# Patient Record
Sex: Female | Born: 1955 | Race: Black or African American | Hispanic: No | Marital: Single | Smoking: Current every day smoker
Health system: Southern US, Community
[De-identification: ages and names within clinical notes are randomized; demographics above are authoritative.]

## PROBLEM LIST (undated history)

## (undated) DIAGNOSIS — K08109 Complete loss of teeth, unspecified cause, unspecified class: Secondary | ICD-10-CM

## (undated) DIAGNOSIS — Z86018 Personal history of other benign neoplasm: Secondary | ICD-10-CM

## (undated) DIAGNOSIS — R0600 Dyspnea, unspecified: Secondary | ICD-10-CM

## (undated) DIAGNOSIS — E210 Primary hyperparathyroidism: Secondary | ICD-10-CM

## (undated) DIAGNOSIS — R350 Frequency of micturition: Secondary | ICD-10-CM

## (undated) DIAGNOSIS — N201 Calculus of ureter: Secondary | ICD-10-CM

## (undated) DIAGNOSIS — E559 Vitamin D deficiency, unspecified: Secondary | ICD-10-CM

## (undated) DIAGNOSIS — N182 Chronic kidney disease, stage 2 (mild): Secondary | ICD-10-CM

## (undated) DIAGNOSIS — I10 Essential (primary) hypertension: Secondary | ICD-10-CM

## (undated) DIAGNOSIS — K219 Gastro-esophageal reflux disease without esophagitis: Secondary | ICD-10-CM

## (undated) DIAGNOSIS — E041 Nontoxic single thyroid nodule: Secondary | ICD-10-CM

## (undated) DIAGNOSIS — Z973 Presence of spectacles and contact lenses: Secondary | ICD-10-CM

## (undated) DIAGNOSIS — N200 Calculus of kidney: Secondary | ICD-10-CM

## (undated) HISTORY — PX: LAPAROSCOPIC ASSISTED VAGINAL HYSTERECTOMY: SHX5398

## (undated) HISTORY — PX: MINIMALLY INVASIVE RADIOACTIVE PARATHYROIDECTOMY: SHX5272

## (undated) HISTORY — PX: OTHER SURGICAL HISTORY: SHX169

---

## 1999-09-14 ENCOUNTER — Encounter: Payer: Self-pay | Admitting: Emergency Medicine

## 1999-09-14 ENCOUNTER — Emergency Department (HOSPITAL_COMMUNITY): Admission: EM | Admit: 1999-09-14 | Discharge: 1999-09-14 | Payer: Self-pay | Admitting: Emergency Medicine

## 2000-03-16 ENCOUNTER — Inpatient Hospital Stay (HOSPITAL_COMMUNITY): Admission: EM | Admit: 2000-03-16 | Discharge: 2000-03-19 | Payer: Self-pay | Admitting: Emergency Medicine

## 2000-03-16 ENCOUNTER — Encounter: Payer: Self-pay | Admitting: Emergency Medicine

## 2000-03-17 ENCOUNTER — Encounter: Payer: Self-pay | Admitting: Family Medicine

## 2002-04-01 ENCOUNTER — Emergency Department (HOSPITAL_COMMUNITY): Admission: EM | Admit: 2002-04-01 | Discharge: 2002-04-01 | Payer: Self-pay | Admitting: Emergency Medicine

## 2002-05-01 ENCOUNTER — Encounter: Payer: Self-pay | Admitting: *Deleted

## 2002-05-01 ENCOUNTER — Encounter: Admission: RE | Admit: 2002-05-01 | Discharge: 2002-05-01 | Payer: Self-pay | Admitting: *Deleted

## 2002-05-30 ENCOUNTER — Other Ambulatory Visit: Admission: RE | Admit: 2002-05-30 | Discharge: 2002-05-30 | Payer: Self-pay | Admitting: Obstetrics and Gynecology

## 2002-06-12 ENCOUNTER — Ambulatory Visit (HOSPITAL_COMMUNITY): Admission: RE | Admit: 2002-06-12 | Discharge: 2002-06-12 | Payer: Self-pay | Admitting: Obstetrics and Gynecology

## 2002-06-12 ENCOUNTER — Encounter: Payer: Self-pay | Admitting: Obstetrics and Gynecology

## 2002-07-03 ENCOUNTER — Encounter: Payer: Self-pay | Admitting: Gastroenterology

## 2002-07-03 ENCOUNTER — Encounter: Admission: RE | Admit: 2002-07-03 | Discharge: 2002-07-03 | Payer: Self-pay | Admitting: Gastroenterology

## 2002-09-25 ENCOUNTER — Encounter (INDEPENDENT_AMBULATORY_CARE_PROVIDER_SITE_OTHER): Payer: Self-pay

## 2002-09-25 ENCOUNTER — Inpatient Hospital Stay (HOSPITAL_COMMUNITY): Admission: RE | Admit: 2002-09-25 | Discharge: 2002-09-26 | Payer: Self-pay | Admitting: Obstetrics and Gynecology

## 2005-07-17 ENCOUNTER — Encounter: Admission: RE | Admit: 2005-07-17 | Discharge: 2005-07-17 | Payer: Self-pay | Admitting: Cardiology

## 2005-09-28 ENCOUNTER — Ambulatory Visit (HOSPITAL_COMMUNITY): Admission: RE | Admit: 2005-09-28 | Discharge: 2005-09-28 | Payer: Self-pay | Admitting: General Surgery

## 2005-11-03 ENCOUNTER — Encounter (INDEPENDENT_AMBULATORY_CARE_PROVIDER_SITE_OTHER): Payer: Self-pay | Admitting: Specialist

## 2005-11-03 ENCOUNTER — Ambulatory Visit (HOSPITAL_COMMUNITY): Admission: RE | Admit: 2005-11-03 | Discharge: 2005-11-04 | Payer: Self-pay | Admitting: General Surgery

## 2005-11-03 HISTORY — PX: PARATHYROIDECTOMY: SHX19

## 2006-08-29 ENCOUNTER — Emergency Department (HOSPITAL_COMMUNITY): Admission: EM | Admit: 2006-08-29 | Discharge: 2006-08-29 | Payer: Self-pay | Admitting: Emergency Medicine

## 2006-11-14 ENCOUNTER — Encounter: Admission: RE | Admit: 2006-11-14 | Discharge: 2006-11-14 | Payer: Self-pay | Admitting: Cardiology

## 2010-11-19 ENCOUNTER — Observation Stay (HOSPITAL_COMMUNITY)
Admission: EM | Admit: 2010-11-19 | Discharge: 2010-11-21 | Payer: Self-pay | Source: Home / Self Care | Attending: Internal Medicine | Admitting: Internal Medicine

## 2010-11-20 ENCOUNTER — Encounter (INDEPENDENT_AMBULATORY_CARE_PROVIDER_SITE_OTHER): Payer: Self-pay | Admitting: Family Medicine

## 2010-12-29 ENCOUNTER — Other Ambulatory Visit (HOSPITAL_COMMUNITY): Payer: Self-pay | Admitting: Cardiology

## 2011-01-16 ENCOUNTER — Ambulatory Visit (HOSPITAL_COMMUNITY)
Admission: RE | Admit: 2011-01-16 | Discharge: 2011-01-16 | Disposition: A | Discharge status: Home / Self Care | Payer: Medicaid Other | Source: Ambulatory Visit | Attending: Cardiology | Admitting: Cardiology

## 2011-01-16 DIAGNOSIS — R079 Chest pain, unspecified: Secondary | ICD-10-CM | POA: Insufficient documentation

## 2011-01-16 MED ORDER — TECHNETIUM TC 99M TETROFOSMIN IV KIT
30.0000 | PACK | Freq: Once | INTRAVENOUS | Status: AC | PRN
  Administered 2011-01-16: 30 via INTRAVENOUS

## 2011-01-16 MED ORDER — TECHNETIUM TC 99M TETROFOSMIN IV KIT
10.0000 | PACK | Freq: Once | INTRAVENOUS | Status: AC | PRN
  Administered 2011-01-16: 10 via INTRAVENOUS

## 2011-01-17 LAB — LIPID PANEL
Cholesterol: 172 mg/dL (ref 0–200)
LDL Cholesterol: 79 mg/dL (ref 0–99)
Total CHOL/HDL Ratio: 2.5 RATIO
Triglycerides: 114 mg/dL (ref <150)
VLDL: 23 mg/dL (ref 0–40)

## 2011-01-17 LAB — COMPREHENSIVE METABOLIC PANEL
ALT: 16 U/L (ref 0–35)
AST: 18 U/L (ref 0–37)
Albumin: 4.1 g/dL (ref 3.5–5.2)
Alkaline Phosphatase: 56 U/L (ref 39–117)
CO2: 27 mEq/L (ref 19–32)
Creatinine, Ser: 1.34 mg/dL — ABNORMAL HIGH (ref 0.4–1.2)
Potassium: 4.7 mEq/L (ref 3.5–5.1)
Total Protein: 7.7 g/dL (ref 6.0–8.3)

## 2011-01-17 LAB — CBC
HCT: 34.2 % — ABNORMAL LOW (ref 36.0–46.0)
MCV: 85.3 fL (ref 78.0–100.0)
RDW: 14.9 % (ref 11.5–15.5)

## 2011-01-25 ENCOUNTER — Other Ambulatory Visit: Payer: Self-pay | Admitting: Gastroenterology

## 2011-02-13 LAB — DIFFERENTIAL
Basophils Absolute: 0 10*3/uL (ref 0.0–0.1)
Basophils Relative: 1 % (ref 0–1)
Eosinophils Absolute: 0.2 10*3/uL (ref 0.0–0.7)
Eosinophils Relative: 3 % (ref 0–5)
Lymphocytes Relative: 42 % (ref 12–46)
Monocytes Relative: 7 % (ref 3–12)
Monocytes Relative: 7 % (ref 3–12)
Neutro Abs: 3.5 10*3/uL (ref 1.7–7.7)
Neutro Abs: 4.7 10*3/uL (ref 1.7–7.7)
Neutrophils Relative %: 58 % (ref 43–77)

## 2011-02-13 LAB — LIPID PANEL
HDL: 66 mg/dL (ref >39)
Total CHOL/HDL Ratio: 3.5 RATIO
Triglycerides: 323 mg/dL — ABNORMAL HIGH (ref <150)

## 2011-02-13 LAB — GLUCOSE, CAPILLARY
Glucose-Capillary: 100 mg/dL — ABNORMAL HIGH (ref 70–99)
Glucose-Capillary: 106 mg/dL — ABNORMAL HIGH (ref 70–99)
Glucose-Capillary: 109 mg/dL — ABNORMAL HIGH (ref 70–99)
Glucose-Capillary: 121 mg/dL — ABNORMAL HIGH (ref 70–99)

## 2011-02-13 LAB — TSH: TSH: 2.094 u[IU]/mL (ref 0.350–4.500)

## 2011-02-13 LAB — BASIC METABOLIC PANEL
BUN: 18 mg/dL (ref 6–23)
CO2: 25 mEq/L (ref 19–32)
Calcium: 10 mg/dL (ref 8.4–10.5)
GFR calc non Af Amer: 55 mL/min — ABNORMAL LOW (ref >60)
Sodium: 138 mEq/L (ref 135–145)

## 2011-02-13 LAB — POCT CARDIAC MARKERS: Myoglobin, poc: 78.6 ng/mL (ref 12–200)

## 2011-02-13 LAB — PROTIME-INR
INR: 0.97 (ref 0.00–1.49)
Prothrombin Time: 13.1 seconds (ref 11.6–15.2)

## 2011-02-13 LAB — CBC
HCT: 39.6 % (ref 36.0–46.0)
Hemoglobin: 11.8 g/dL — ABNORMAL LOW (ref 12.0–15.0)
MCH: 28.6 pg (ref 26.0–34.0)
MCH: 28.8 pg (ref 26.0–34.0)
MCHC: 32.8 g/dL (ref 30.0–36.0)
MCV: 89.3 fL (ref 78.0–100.0)
Platelets: 241 10*3/uL (ref 150–400)
Platelets: 274 10*3/uL (ref 150–400)
RBC: 4.51 MIL/uL (ref 3.87–5.11)

## 2011-02-13 LAB — COMPREHENSIVE METABOLIC PANEL
Albumin: 4 g/dL (ref 3.5–5.2)
BUN: 14 mg/dL (ref 6–23)
CO2: 24 mEq/L (ref 19–32)
Chloride: 106 mEq/L (ref 96–112)
GFR calc Af Amer: >60 mL/min (ref >60)
Potassium: 4.3 mEq/L (ref 3.5–5.1)

## 2011-02-13 LAB — CARDIAC PANEL(CRET KIN+CKTOT+MB+TROPI)
CK, MB: 2.7 ng/mL (ref 0.3–4.0)
Relative Index: 1.7 (ref 0.0–2.5)
Relative Index: 1.8 (ref 0.0–2.5)
Relative Index: 2 (ref 0.0–2.5)
Total CK: 134 U/L (ref 7–177)

## 2011-04-21 NOTE — Op Note (Signed)
NAME:  Sherry Arroyo, Sherry Arroyo                       ACCOUNT NO.:  [id number]   MEDICAL RECORD NO.:  [id number]                   PATIENT TYPE:  INP   LOCATION:  9399                                 FACILITY:  [facility]   PHYSICIAN:  Naima A. Dillard, M.D.              DATE OF BIRTH:  09/04/1956   DATE OF PROCEDURE:  DATE OF DISCHARGE:                                 OPERATIVE REPORT   PREOPERATIVE DIAGNOSES:  Symptomatic fibroids, patient desired definitive  treatment.   POSTOPERATIVE DIAGNOSES:  Symptomatic fibroids, patient desired definitive  treatment.   PROCEDURE:  1. Laparoscopic assisted vaginal hysterectomy.  2. Bilateral salpingo-oophorectomy.   SURGEON:  Naima A. Normand Sloop, M.D.   ASSISTANTMarquis Lunch. Adline Peals.   ANESTHESIA:  General endotracheal tube.   ESTIMATED BLOOD LOSS:  350 cc.   IV FLUIDS:  3000 cc crystalloid.   URINE OUTPUT:  1400 cc clear urine.   COMPLICATIONS:  None.   FINDINGS:  A 12-week-sized myomatous uterus with normal adnexa bilaterally,  normal appendix, and normal abdominal anatomy.  She did have a small omental  adhesion to the anterior abdominal wall.   DISPOSITION:  The patient went to recovery room in stable condition.   PROCEDURE:  Before the procedure took place, I did a wet mount to make sure  she had a trichomonas __________ cure and there were no trichomonas seen on  the wet mount.  We also discussed more self-initiative and whether she  wanted her ovaries removed and patient stated she wanted her ovaries  removed.  The patient also understood the risks of the surgery are, but not  limited to, bleeding, infection, damage to internal organs like bowel,  bladder, ureters, and internal blood vessels.   The patient was taken to the operating room, given general anesthesia, and  placed in the dorsal lithotomy position.  She was prepped and draped in a  normal sterile fashion and a bivalve speculum was placed into the vagina.  A  Hulka  tenaculum was placed into the uterine cavity as a means to manipulate  the uterus.  A Foley catheter was placed into the bladder without  difficulty.  Attention was then turned to the patient's abdomen.  She had a  previous tubal which obliterated her umbilicus.  Because of this we did an  open laparoscopy.  Marcaine 5 cc was placed into the incision site and  opened to the fascia.  The fascia was then incised in the midline and  opened.  Peritoneum was identified, tented up and opened without difficulty.  The Hasson was then placed and the fascia was tagged with 0 Vicryl and  attached to the Hasson.  The abdomen was then insufflated with CO2 gas.  Two  lower quadrant 5 mm incisions were made, one on the right and one on the  left.  5 cc Marcaine was placed first and two 5 mm umbilical ports were  placed under direct visualization of the laparoscope.  Hemostasis was  assured.  Attention was then turned to the patient's right round ligament  which was tented up, cauterized, and cut without difficulty.  The patient's  left round ligament was identified, cauterized, and cut without difficulty.  Hydrodissection was placed into the vesicouterine peritoneum and bladder  flap plane was created with Metzenbaum scissors, placed through the port and  bladder flap was created using Metzenbaum scissors and pushed away bluntly.  The right ovarian uterine ligament was cauterized and cut.  Hemostasis was  assured.  The right ureter was then located, noted to peristalse, and be way  below the infundibulopelvic ligament.  The infundibulopelvic ligament was  then ligated with two Endo loops.  The ovary was cut away.  Hemostasis was  noted and assured.  The ovary and tube were placed in the cul-de-sac.  The  patient's left uterine ovarian ligaments were cauterized and cut and  hemostasis noted.  The ureter was located on the left and noted to be far  away from the infundibulopelvic ligament.  The two Endo  loops were placed  and the ovary and tube were cut.  Hemostasis was assured and the ovary and  tube were placed in the cul-de-sac.  The air was allowed to leave the  abdomen.  Attention was then turned to the vagina.  The patient's legs were  elevated.  Two tenaculums were placed on the lower cervix.  20 units of  vasopressin and 100 cc of normal saline was mixed and 20 cc was used to  infiltrate the entire cervix.  A circumferential incision was then made  around the cervix and the anterior peritoneum was identified, tented up, and  entered sharply with Metzenbaum scissors.  The cavity was opened and the  Foley catheter was noted to be in place.  I could feel the fibroids along  the uterus __________ and anteriorly.  I then went to the posterior aspect  peritoneum.  It was identified, tented up, and entered sharply.  Gas was let  out of this area as we knew we were into the peritoneum posteriorly.  Both  uterosacral ligaments were Heaney clamped, cut, and suture ligated with  Vicryl.  Hemostasis was assured.  Both were held with hemostats.  The  uterine arteries were doubly clamped, cut, and suture ligated.  Hemostasis  was assured.  Upon opening the posterior peritoneal space, the ovaries and  tubes were removed and were laying in the cul-de-sac without difficulty.  The uterus was then delivered with the help of towel clamps and there was a  small attachment on both sides to the broad ligament which was clamped with  Tresa Endo, cut, and suture ligated with a free tie first.  Hemostasis was  assured.  There was a small amount of bleeding near the uterosacral ligament  which was made hemostatic with a figure-of-eight stitch.  Hemostasis was  then noted except for around the cuff.  The cuff was closed with 0 Vicryl in  figure-of-eight stitches in the vertical fashion.  Before the cuff was  closed a McCall suture was placed into the posterior aspect of the vagina to the right uterosacral ligament  to the peritoneum to the left uterosacral  ligament and back out of the vagina.  The cuff was closed.  The three  uterosacral ligaments were tied together.  The cuff stitch was also tied  together and hemostasis noted.  A McCall suture was then tied and noted  to  elevate the vagina.  Hemostasis was noted.  The vagina was packed with  vaginal packing laced with triple sulfa cream.  Attention was then turned  back into the abdomen.  Both infundibulopelvic pedicles were noted to be  hemostatic.  Right along the vaginal cuff there was minimal oozing which was  made hemostatic with slight touch of cautery.  Again, irrigation was done  and hemostasis was noted.  All instruments were removed from the abdomen  under direct visualization of the laparoscope.  The 10 mm Hasson port was  closed with 0 Vicryl.  The skin was closed with 3-0 Vicryl in a subcuticular  fashion.  The 10 mm port was closed with 0 Vicryl.  The two 5 mm skin  incisions were closed in a subcuticular fashion using 3-0 Vicryl.  Hemostasis was assured.  Sponge, lap, and needle counts were correct x2.  The patient went to the recovery room in stable condition.                                               Naima A. Normand Sloop, M.D.    NAD/MEDQ  D:  09/25/2002  T:  09/25/2002  Job:  [id number]

## 2011-04-21 NOTE — Discharge Summary (Signed)
[facility]  Patient:    Sherry Arroyo, Sherry Arroyo                    MRN: [id number] Adm. Date:  [id number] Disc. Date: 03/19/00 Attending:  McDiarmid, Leighton Roach. Dictator:   Guadalupe Dawn, M.D.                           Discharge Summary  DISCHARGE DIAGNOSES: 1. Multilobar community-acquired pneumonia. 2. Polysubstance abuse.  DISCHARGE MEDICATIONS:  Azithromycin 250 mg 1 p.o. q.d. x 7 days.  DIAGNOSTIC STUDIES:  CT of the chest with and without contrast on March 18, 2000 showed no abnormality except patchy infiltrates in the right middle and lower lobes.  FOLLOW-UP:  The patient will contact [facility] for follow-up.  BRIEF HISTORY AND PHYSICAL:  This 55 year old African-American female who has a history of recurrent right middle lobe pneumonia presented with a two-day history of increased work of breathing, cough productive of yellow sputum, chills, and loss of appetite.  This illness had an acute onset.  She denied hemoptysis, nausea, or vomiting.  She admitted to smoking crack three days prior to admission and admitted to chronic alcohol abuse.  She also admitted to smoking 1-1/2 packs per day x 26 years.  PHYSICAL EXAMINATION:  VITAL SIGNS:  Temperature 101.7, oxygen saturation 85% on room air.  LUNGS:  Dense rhonchi in the right lower lobe and rales to the mid-lung fields on the right.  Also, a 2/6 systolic ejection murmur.  Chest x-ray showed right middle lobe and right lower lobe infiltrates.  HOSPITAL COURSE: #1 - PNEUMONIA:  The patient was started on intravenous Rocephin and azithromycin and had good response, becoming afebrile within 24 hours.  She was switched to oral antibiotics on hospital day #2 and remained afebrile until discharge.  Because of her history of recurrent pneumonia, CT scan of the chest with and without contrast was performed with the results noted above.  At the time of discharge, she was tolerating a regular  diet, ambulating without difficulty, and had normal oxygen saturations on room air and was afebrile.  #2 - POLYSUBSTANCE ABUSE:  Rehabilitation was offered by medical doctor and was refused.  She was advised of the health risks and stated that she understood them. DD:  03/19/00 TD:  03/19/00 Job: [id number]

## 2011-04-21 NOTE — Discharge Summary (Signed)
   NAME:  Sherry Arroyo, Sherry Arroyo                       ACCOUNT NO.:  [id number]   MEDICAL RECORD NO.:  [id number]                   PATIENT TYPE:  AMB   LOCATION:  ENDO                                 FACILITY:  [facility]   PHYSICIAN:  Naima A. Dillard, M.D.              DATE OF BIRTH:  Dec 25, 1955   DATE OF ADMISSION:  DATE OF DISCHARGE:                                 DISCHARGE SUMMARY   PER DISCHARGE DIAGNOSIS:  Symptomatic fibroids.   POSTOP DISCHARGE DIAGNOSIS:  Status post lower anterior vaginal  hysterectomy, bilateral salpingo-oophorectomy for symptomatic fibroids.   DISCHARGE MEDICATIONS:  Includes Tylox, Motrin, Phenergan, iron, and Colace.   ACTIVITY:  To remain on pelvic rest for six weeks. No heavy lifting for four  weeks and no driving for two weeks.   FOLLOW UP:  The patient will follow-up with [facility] on November 07, 2002 for postoperative visit.   HOSPITAL COURSE:  The patient underwent LAVH and bilateral salpingo-  oophorectomy for symptomatic fibroids. On postoperative day zero, she was  doing well. Stated that she was hungry and tolerated a full liquid diet. She  had flatus and still tolerated full died. Her abdomen was soft and the  incision was clean, dry, and intact. The patient was discharged to home with  instructions as above.   LABORATORY DATA:  Preoperative hemoglobin 10.3 and postoperative was 8.7.  Preoperative  BUN and creatinine was 11 and 1. Postoperative BUN and  creatinine was 3 and 0.8.   DISPOSITION:  The patient was discharged to home in stable condition.                                               Naima A. Normand Sloop, M.D.    NAD/MEDQ  D:  10/24/2002  T:  10/24/2002  Job:  [id number]

## 2011-04-21 NOTE — H&P (Signed)
NAME:  Sherry Arroyo, Sherry Arroyo                       ACCOUNT NO.:  [id number]   MEDICAL RECORD NO.:  [id number]                   PATIENT TYPE:  INP   LOCATION:  NA                                   FACILITY:  [facility]   PHYSICIAN:  Naima A. Dillard, M.D.              DATE OF BIRTH:  [birth year]   DATE OF ADMISSION:  DATE OF DISCHARGE:                                HISTORY & PHYSICAL   CHIEF COMPLAINT:  Symptomatic fibroids.   HISTORY OF PRESENT ILLNESS:  The patient is a 55 year old African American  female, G2, P2, who presented to me in June of 2003 complaining of lower  abdominal pain and dysmenorrhea for two months.  She had tried Tylenol No. 3  and Vioxx and got no relief.  The patient was sent to the urologist for  evaluation by her family care doctor.  He got a CT scan which was  significant for fibroids.  The patient also complained of dyspareunia.  The  patient states that her menses are not heavy, but she does have severe  cramping during menses.  She also has tried Advil and Motrin over the  counter, which gave her no relief.  The patient did not have any vaginal  discharge, odor, fever, or irregular periods, except for on one occasion.  She does have some dyspareunia.  She denied any urinary frequency, urgency,  dysuria, or hematuria.  Denied any history of kidney stones.  She does have  occasional constipation.  She denied any diarrhea, rectal bleeding, nausea,  vomiting, or history of endometriosis.  On ultrasound on June 12, 2002, the  patient had normal ovaries.  The uterus was 11.6 x 4.2 with at least about  five fibroids.  The largest was 3 x 4 cm.  The endometrial stripe measured 8  mm in thickness.  The patient was given other alternatives for the treatment  of fibroids.  However, she decided she wanted definitive treatment.   PAST MEDICAL HISTORY:  Unremarkable.   PAST SURGICAL HISTORY:  Significant for bilateral tubal ligation done  laparoscopy and left hand  surgery.   PAST OBSTETRICAL HISTORY:  Significant for full-term vaginal deliveries x 2  without any complications.   PAST GYNECOLOGICAL HISTORY:  Menarche at age 89 occurring every 28 days and  lasting for four days.  Denied having any intramenstrual bleeding, except  for on one occasion.  She does have intercourse with one partner without  using a condom.  She has no history of abnormal Pap smear.  She denied any  history of sexually transmitted diseases.  She denies having any menopausal  symptoms or irregular bleeding.   REVIEW OF SYMPTOMS:  The patient did not have any endocrine disorders.  She  did not have an cardiorespiratory disorders.  GASTROINTESTINAL:  She does  have occasional constipation.  GENITOURINARY:  As above.  She denied having  any autoimmune, neurologic, or musculoskeletal issues.  PHYSICAL EXAMINATION:  WEIGHT:  159 pounds.  VITAL SIGNS:  Blood pressure 120/80.  HEENT:  Pupils are equal.  Hearing is normal.  The throat is clear.  The  thyroid is not enlarged.  HEART:  Regular rate and rhythm.  LUNGS:  Clear to auscultation bilaterally.  BREASTS:  No masses, discharge, skin changes, or nipple retraction  bilaterally.  BACK:  No CVA tenderness.  ABDOMEN:  Nontender without any organomegaly.  EXTREMITIES:  No cyanosis, clubbing, or edema.  NEUROLOGIC:  Within normal limits.  PELVIC:  The vulva exam is within normal limits.  The vagina was normal.  The cervix was nontender without lesions.  The uterus was about 12 weeks  size, irregular, and nontender.  The adnexa had no masses and are nontender  bilaterally.   LABORATORY DATA:  Her UA was normal.  The most recent hemoglobin was 11.9.  A repeat hemoglobin was 10, so she does have some bleeding.  An endometrial  biopsy showed benign secretory endometrium and no hyperplasia or malignancy.  Ultrasound is as above.  The patient had a normal mammogram in July of 2003.  Her GC and chlamydia were both negative in  June of 2003.  Her Pap smear was  significant for Trichomonas in June of 2003.   ASSESSMENT:  1. Symptomatic fibroids.  2. Trichomonas.   The patient denies definitive therapy.  She desires a hysterectomy.  The  women's health initiative was reviewed.  The risks and benefits of removing  the ovaries were reviewed.  At this time, the patient is still desirous to  have bilateral salpingo-oophorectomy.  I will discuss this once again with  the patient right before surgery to make sure that she definitely wants her  ovaries removed.  She understands that the risks of the surgery are, but not  limited to, bleeding, infection, damage to internal organs, such as bowel,  bladder, and major blood vessels.  She was given Flagyl and stressed the  importance of using a condom with her partner, especially to avoid cuff  cellulitis after surgery.  I will check a wet mount before we proceed with  surgery for Trichomonas and if present, will give Flagyl intraoperatively.                                               Naima A. Normand Sloop, M.D.    NAD/MEDQ  D:  09/24/2002  T:  09/24/2002  Job:  [id number]

## 2011-04-21 NOTE — Op Note (Signed)
NAME:  Sherry Arroyo, Sherry Arroyo             ACCOUNT NO.:  [id number]   MEDICAL RECORD NO.:  [id number]          PATIENT TYPE:  OIB   LOCATION:  5727                         FACILITY:  [facility]   PHYSICIAN:  Leonie Man, M.D.   DATE OF BIRTH:  11/26/1956   DATE OF PROCEDURE:  11/03/2005  DATE OF DISCHARGE:  11/04/2005                                 OPERATIVE REPORT   PREOPERATIVE DIAGNOSIS:  Primary hyperparathyroidism.   POSTOPERATIVE DIAGNOSIS:  Primary hyperparathyroidism.   PROCEDURE:  MIRP (minimally invasive subparathyroidectomy).   SURGEON:  Leonie Man, M.D.   ASSISTANT:  Rose Phi. Maple Hudson, M.D.   ANESTHESIA:  General.   NOTE:  The patient is a 55 year old female presenting with hypercalcemia and  elevated parathormone level. She underwent diagnostic sestamibi scan which  indicated that presence of an increased uptake in the upper pole of the left  portion of her thyroid, consistent with a parathyroid adenoma.  The patient  comes to the operating room now for MIRP after the risks and potential  benefits of surgery have been discussed, all questions answered and consent  obtained.   DESCRIPTION OF PROCEDURE:  Following the induction of satisfactory general  anesthesia, the patient is positioned supinely; the head and neck  hyperextended. The neck was then prepped and draped to be included in a  sterile operative field. A transverse incision is carried down over the left  lateral half of the neck, deepened through skin and subcutaneous tissues,  and across the platysma muscle.  The platysma muscle is raised as a flap  superiorly and inferiorly. The midline is opened and the strap muscles  retracted laterally on the left side. Dissection is carried up toward the  upper pole of the parathyroid; and with the use of the NeoProbe, an area of  increased uptake is noted and dissection carried down to this area revealing  a very large parathyroid adenoma. This was dissected away from  the  surrounding tissues, maintaining hemostasis with stainless steel clips.   The parathyroid adenoma is removed in its entirety and forwarded for  pathologic evaluation. Frozen section diagnosis confirms the presence of  parathyroid tissue consistent with parathyroid adenoma. All areas of  dissection checked for hemostasis; noted to be dry.  I placed a small  Surgicel pad in the region of dissection for additional hemostasis. Sponge  and instrument counts were verified. The midline is closed with interrupted  3-0 Vicryl suture.  The strap muscles closed with interrupted 3-0 Vicryl suture; and the skin  closed with a running 5-0 Monocryl suture, and then reinforced with Steri-  Strips; sterile dressings applied. Anesthetic reversed; patient removed from  the operating room to the recovery room in stable condition. She tolerated  the procedure well.      Leonie Man, M.D.  Electronically Signed     PB/MEDQ  D:  11/03/2005  T:  11/05/2005  Job:  [id number]   cc:   Osvaldo Shipper. Spruill, M.D.  Fax: [phone]

## 2011-12-14 IMAGING — CR DG CHEST 1V PORT
1 series · 1 of 1 positions shown · non-contrast
Comparison: 11/14/2006

CLINICAL DATA: Chest pain, shortness of breath.

PORTABLE CHEST - 1 VIEW

[AP]
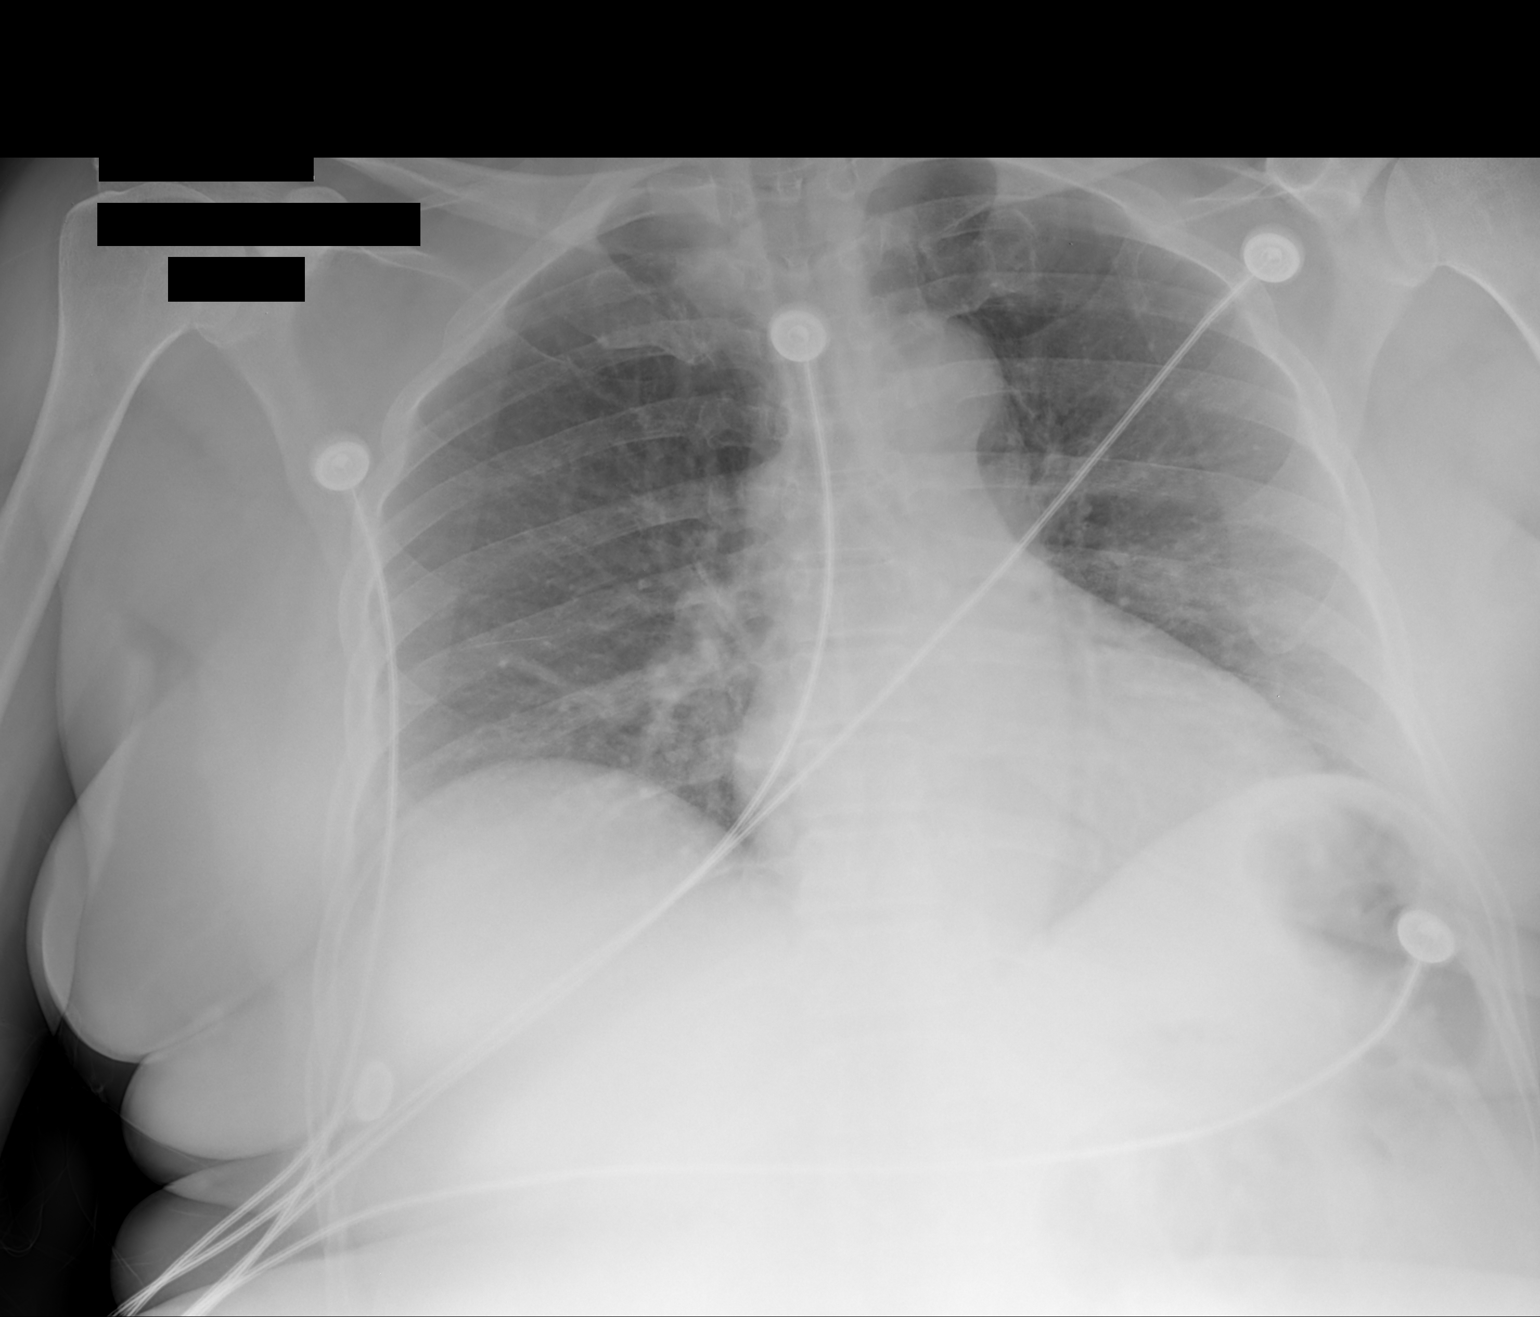

[1 of 1 positions shown; findings below may reference images not displayed]

FINDINGS: There are low lung volumes with minimal right base
atelectasis.  Heart is upper limits normal in size.  Mild
peribronchial thickening.  No effusions or acute bony abnormality.
IMPRESSION: Low lung volumes, right base atelectasis.  Mild bronchitic change.

## 2014-10-16 ENCOUNTER — Encounter (HOSPITAL_COMMUNITY): Payer: Self-pay | Admitting: Emergency Medicine

## 2014-10-16 ENCOUNTER — Emergency Department (HOSPITAL_COMMUNITY)
Admission: EM | Admit: 2014-10-16 | Discharge: 2014-10-16 | Disposition: A | Discharge status: Home / Self Care | Payer: Medicaid Other | Attending: Emergency Medicine | Admitting: Emergency Medicine

## 2014-10-16 ENCOUNTER — Emergency Department (HOSPITAL_COMMUNITY): Payer: Medicaid Other

## 2014-10-16 DIAGNOSIS — M79671 Pain in right foot: Secondary | ICD-10-CM

## 2014-10-16 DIAGNOSIS — I10 Essential (primary) hypertension: Secondary | ICD-10-CM | POA: Insufficient documentation

## 2014-10-16 DIAGNOSIS — Z720 Tobacco use: Secondary | ICD-10-CM | POA: Diagnosis not present

## 2014-10-16 DIAGNOSIS — M25571 Pain in right ankle and joints of right foot: Secondary | ICD-10-CM | POA: Diagnosis present

## 2014-10-16 DIAGNOSIS — R52 Pain, unspecified: Secondary | ICD-10-CM

## 2014-10-16 HISTORY — DX: Essential (primary) hypertension: I10

## 2014-10-16 MED ORDER — IBUPROFEN 800 MG PO TABS: 800.0000 mg | ORAL_TABLET | Freq: Three times a day (TID) | ORAL | Status: DC | PRN

## 2014-10-16 NOTE — ED Notes (Signed)
ED staff will transport to xray.

## 2014-10-16 NOTE — Discharge Instructions (Signed)
Read the information below.  Use the prescribed medication as directed.  Please discuss all new medications with your pharmacist.  You may return to the Emergency Department at any time for worsening condition or any new symptoms that concern you.  If you develop uncontrolled pain, weakness or numbness of the extremity, severe discoloration of the skin, or you are unable to walk, return to the ER for a recheck.      Musculoskeletal Pain Musculoskeletal pain is muscle and boney aches and pains. These pains can occur in any part of the body. Your caregiver may treat you without knowing the cause of the pain. They may treat you if blood or urine tests, X-rays, and other tests were normal.  CAUSES There is often not a definite cause or reason for these pains. These pains may be caused by a type of germ (virus). The discomfort may also come from overuse. Overuse includes working out too hard when your body is not fit. Boney aches also come from weather changes. Bone is sensitive to atmospheric pressure changes. HOME CARE INSTRUCTIONS   Ask when your test results will be ready. Make sure you get your test results.  Only take over-the-counter or prescription medicines for pain, discomfort, or fever as directed by your caregiver. If you were given medications for your condition, do not drive, operate machinery or power tools, or sign legal documents for 24 hours. Do not drink alcohol. Do not take sleeping pills or other medications that may interfere with treatment.  Continue all activities unless the activities cause more pain. When the pain lessens, slowly resume normal activities. Gradually increase the intensity and duration of the activities or exercise.  During periods of severe pain, bed rest may be helpful. Lay or sit in any position that is comfortable.  Putting ice on the injured area.  Put ice in a bag.  Place a towel between your skin and the bag.  Leave the ice on for 15 to 20 minutes, 3  to 4 times a day.  Follow up with your caregiver for continued problems and no reason can be found for the pain. If the pain becomes worse or does not go away, it may be necessary to repeat tests or do additional testing. Your caregiver may need to look further for a possible cause. SEEK IMMEDIATE MEDICAL CARE IF:  You have pain that is getting worse and is not relieved by medications.  You develop chest pain that is associated with shortness or breath, sweating, feeling sick to your stomach (nauseous), or throw up (vomit).  Your pain becomes localized to the abdomen.  You develop any new symptoms that seem different or that concern you. MAKE SURE YOU:   Understand these instructions.  Will watch your condition.  Will get help right away if you are not doing well or get worse. Document Released: 11/20/2005 Document Revised: 02/12/2012 Document Reviewed: 07/25/2013 [facility] Patient Information 2015 [place], [place]. This information is not intended to replace advice given to you by your health care provider. Make sure you discuss any questions you have with your health care provider.

## 2014-10-16 NOTE — ED Notes (Signed)
Patient states R ankle pain.  Patient denies injury.  Patient states that she can't "put any weight on it".

## 2014-10-16 NOTE — ED Provider Notes (Signed)
CSN: [id number]     Arrival date & time 10/16/14  5916 History   First MD Initiated Contact with Patient 10/16/14 [phone]     Chief Complaint  Patient presents with  . Ankle Pain     (Consider location/radiation/quality/duration/timing/severity/associated sxs/prior Treatment) HPI   Patient presents with right ankle and right foot pain that began two days ago without injury.  Reports pain is aching and constant, worse with walking.  She denies any injury, denies leg swelling, denies new shoes or standing for long periods of time.  No hx gout, no recent infections.   Past Medical History  Diagnosis Date  . Hypertension    History reviewed. No pertinent past surgical history. No family history on file. History  Substance Use Topics  . Smoking status: Current Every Day Smoker -- 0.25 packs/day    Types: Cigarettes  . Smokeless tobacco: Not on file  . Alcohol Use: 0.6 oz/week    1 Cans of beer per week   OB History    No data available     Review of Systems  Constitutional: Negative for fever.  Cardiovascular: Negative for leg swelling.  Musculoskeletal: Positive for joint swelling and arthralgias. Negative for myalgias.  Skin: Negative for color change, pallor, rash and wound.  Allergic/Immunologic: Negative for immunocompromised state.  Neurological: Negative for weakness and numbness.  Hematological: Does not bruise/bleed easily.      Allergies  Review of patient's allergies indicates no known allergies.  Home Medications   Prior to Admission medications   Not on File   BP 118/94 mmHg  Pulse 94  Temp(Src) 97.7 F (36.5 C) (Oral)  Resp 22  SpO2 96% Physical Exam  Constitutional: She appears well-developed and well-nourished. No distress.  HENT:  Head: Normocephalic and atraumatic.  Neck: Neck supple.  Pulmonary/Chest: Effort normal.  Musculoskeletal:       Right ankle: She exhibits swelling. She exhibits normal range of motion, no ecchymosis, no deformity,  no laceration and normal pulse. Tenderness. Medial malleolus tenderness found. Achilles tendon normal.       Right lower leg: Normal.       Right foot: There is tenderness. There is normal range of motion, no bony tenderness, no swelling, normal capillary refill, no crepitus, no deformity and no laceration.       Feet:  No erythema, edema, warmth of right ankle of foot.   Neurological: She is alert.  Skin: She is not diaphoretic.  Nursing note and vitals reviewed.   ED Course  Procedures (including critical care time) Labs Review Labs Reviewed - No data to display  Imaging Review Dg Ankle Complete Right  10/16/2014   CLINICAL DATA:  Lateral ankle swelling, no known trauma  EXAM: RIGHT ANKLE - COMPLETE 3+ VIEW  COMPARISON:  None.  FINDINGS: There is no evidence of fracture, dislocation, or joint effusion. There is no evidence of arthropathy or other focal bone abnormality. Soft tissues are unremarkable.  IMPRESSION: Negative.   Electronically Signed   By: Conchita Paris M.D.   On: 10/16/2014 09:54     EKG Interpretation None      MDM   Final diagnoses:  Right foot pain    Afebrile, nontoxic patient with right medial ankle, foot pain - suspect plantar fasciitis vs mild sprain.  No bony tenderness.  Neurovascularly intact.  No leg swelling.  No e/o septic joint or gout.  Xrays negative.  D/C home with ace wrap, RICE instructions, ibuprofen.   Pt declined crutches. Discussed  result, findings, treatment, and follow up  with patient.  Pt given return precautions.  Pt verbalizes understanding and agrees with plan.         Clayton Bibles, PA-C 10/16/14 [place], MD 10/16/14 [phone]

## 2016-10-20 ENCOUNTER — Emergency Department (HOSPITAL_COMMUNITY): Payer: Medicaid Other

## 2016-10-20 ENCOUNTER — Encounter (HOSPITAL_COMMUNITY): Payer: Self-pay | Admitting: Emergency Medicine

## 2016-10-20 ENCOUNTER — Emergency Department (HOSPITAL_COMMUNITY)
Admission: EM | Admit: 2016-10-20 | Discharge: 2016-10-20 | Disposition: A | Discharge status: Home / Self Care | Payer: Medicaid Other | Attending: Physician Assistant | Admitting: Physician Assistant

## 2016-10-20 DIAGNOSIS — Y92410 Unspecified street and highway as the place of occurrence of the external cause: Secondary | ICD-10-CM | POA: Insufficient documentation

## 2016-10-20 DIAGNOSIS — Y999 Unspecified external cause status: Secondary | ICD-10-CM | POA: Diagnosis not present

## 2016-10-20 DIAGNOSIS — Z79899 Other long term (current) drug therapy: Secondary | ICD-10-CM | POA: Diagnosis not present

## 2016-10-20 DIAGNOSIS — F17210 Nicotine dependence, cigarettes, uncomplicated: Secondary | ICD-10-CM | POA: Insufficient documentation

## 2016-10-20 DIAGNOSIS — Y939 Activity, unspecified: Secondary | ICD-10-CM | POA: Diagnosis not present

## 2016-10-20 DIAGNOSIS — S20212A Contusion of left front wall of thorax, initial encounter: Secondary | ICD-10-CM

## 2016-10-20 DIAGNOSIS — I10 Essential (primary) hypertension: Secondary | ICD-10-CM | POA: Diagnosis not present

## 2016-10-20 DIAGNOSIS — S299XXA Unspecified injury of thorax, initial encounter: Secondary | ICD-10-CM | POA: Diagnosis present

## 2016-10-20 MED ORDER — IBUPROFEN 800 MG PO TABS: 800.0000 mg | ORAL_TABLET | Freq: Three times a day (TID) | ORAL | 0 refills | Status: DC | PRN
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
## 2016-10-20 NOTE — ED Notes (Signed)
Patient transported to X-ray 

## 2016-10-20 NOTE — Discharge Instructions (Signed)
Follow-up with her primary care doctor.  Return here as needed.  Use ice and heat on the area is not sore.  Your x-rays did not show any abnormalities

## 2017-01-06 ENCOUNTER — Emergency Department (HOSPITAL_COMMUNITY): Payer: Medicaid Other

## 2017-01-06 ENCOUNTER — Emergency Department (HOSPITAL_COMMUNITY)
Admission: EM | Admit: 2017-01-06 | Discharge: 2017-01-06 | Disposition: A | Discharge status: Home / Self Care | Payer: Medicaid Other | Attending: Emergency Medicine | Admitting: Emergency Medicine

## 2017-01-06 ENCOUNTER — Encounter (HOSPITAL_COMMUNITY): Payer: Self-pay | Admitting: Emergency Medicine

## 2017-01-06 DIAGNOSIS — I10 Essential (primary) hypertension: Secondary | ICD-10-CM

## 2017-01-06 DIAGNOSIS — W010XXA Fall on same level from slipping, tripping and stumbling without subsequent striking against object, initial encounter: Secondary | ICD-10-CM | POA: Diagnosis not present

## 2017-01-06 DIAGNOSIS — F17210 Nicotine dependence, cigarettes, uncomplicated: Secondary | ICD-10-CM | POA: Diagnosis not present

## 2017-01-06 DIAGNOSIS — Y999 Unspecified external cause status: Secondary | ICD-10-CM | POA: Insufficient documentation

## 2017-01-06 DIAGNOSIS — Y939 Activity, unspecified: Secondary | ICD-10-CM | POA: Diagnosis not present

## 2017-01-06 DIAGNOSIS — Y929 Unspecified place or not applicable: Secondary | ICD-10-CM | POA: Diagnosis not present

## 2017-01-06 DIAGNOSIS — S4991XA Unspecified injury of right shoulder and upper arm, initial encounter: Secondary | ICD-10-CM | POA: Diagnosis present

## 2017-01-06 DIAGNOSIS — S42291A Other displaced fracture of upper end of right humerus, initial encounter for closed fracture: Secondary | ICD-10-CM | POA: Insufficient documentation

## 2017-01-06 DIAGNOSIS — S42201A Unspecified fracture of upper end of right humerus, initial encounter for closed fracture: Secondary | ICD-10-CM

## 2017-01-06 MED ORDER — HYDROCODONE-ACETAMINOPHEN 5-325 MG PO TABS: 1.0000 | ORAL_TABLET | Freq: Four times a day (QID) | ORAL | 0 refills | Status: DC | PRN

## 2017-01-06 MED ORDER — HYDROMORPHONE HCL 2 MG/ML IJ SOLN
1.0000 mg | Freq: Once | INTRAMUSCULAR | Status: AC
Start: 2017-01-06 — End: 2017-01-06
  Administered 2017-01-06: 1 mg via INTRAMUSCULAR
  Filled 2017-01-06: qty 1

## 2017-01-06 NOTE — ED Notes (Signed)
Shoulder immobilizer provided to patient and placed on pt.

## 2017-01-06 NOTE — ED Triage Notes (Signed)
The patient said she fell this morning and landed on her right shoulder.  She said she think she tripped over a log and fell.  The patient took an advil but it is not working.    The patient rates her pain 10/10.

## 2017-01-06 NOTE — ED Provider Notes (Signed)
[facility] DEPT Provider Note   CSN: [id number] Arrival date & time: 01/06/17  0907     History   Chief Complaint Chief Complaint  Patient presents with  . Fall    The patient said she fell this morning and landed on her right shoulder.  She said she think she tripped over a log and fell.  The patient took an advil but it is not working.      HPI Sherry Arroyo is a 61 y.o. female.  Patient states tripped and fell over stump this AM, falling onto outstretched right arm. C/o right shoulder pain since fall. Pain is constant, dull, mod-severe, worse w movement. Is right hand dominant. No numbness/weakness of extremities. Denies any faintness or dizziness prior to fall. No loc w fall. Denies head injury or headache. No neck or back pain. Denies any other pain or injury. Skin intact. Took tylenol without relief at home.    The history is provided by the patient.  Fall  Pertinent negatives include no chest pain, no abdominal pain, no headaches and no shortness of breath.    Past Medical History:  Diagnosis Date  . Hypertension     There are no active problems to display for this patient.   History reviewed. No pertinent surgical history.  OB History    No data available       Home Medications    Prior to Admission medications   Medication Sig Start Date End Date Taking? Authorizing Provider  amLODipine (NORVASC) 10 MG tablet Take 10 mg by mouth daily.  08/18/16  Yes Historical Provider, MD  ibuprofen (ADVIL,MOTRIN) 200 MG tablet Take 400 mg by mouth every 6 (six) hours as needed for headache or moderate pain.   Yes Historical Provider, MD  losartan (COZAAR) 100 MG tablet Take 100 mg by mouth daily.  08/18/16  Yes Historical Provider, MD  pravastatin (PRAVACHOL) 40 MG tablet Take 40 mg by mouth daily.  08/18/16  Yes Historical Provider, MD  ibuprofen (ADVIL,MOTRIN) 800 MG tablet Take 1 tablet (800 mg total) by mouth every 8 (eight) hours as needed. Patient not taking:  Reported on 01/06/2017 10/20/16   Dalia Heading, PA-C    Family History History reviewed. No pertinent family history.  Social History Social History  Substance Use Topics  . Smoking status: Current Every Day Smoker    Packs/day: 0.25    Types: Cigarettes  . Smokeless tobacco: Current User  . Alcohol use 0.6 oz/week    1 Cans of beer per week     Allergies   Patient has no known allergies.   Review of Systems Review of Systems  Constitutional: Negative for fever.  HENT: Negative for nosebleeds.   Eyes: Negative for redness.  Respiratory: Negative for shortness of breath.   Cardiovascular: Negative for chest pain.  Gastrointestinal: Negative for abdominal pain and vomiting.  Genitourinary: Negative for flank pain.  Musculoskeletal: Negative for back pain and neck pain.  Skin: Negative for wound.  Neurological: Negative for weakness, numbness and headaches.  Hematological: Does not bruise/bleed easily.  Psychiatric/Behavioral: Negative for confusion.     Physical Exam Updated Vital Signs BP (!) 217/111 (BP Location: Left Arm) Comment: taken twice to verify  Pulse 74   Temp 97.9 F (36.6 C) (Oral)   Resp 18   Ht 5\' 5"  (1.651 m)   Wt 96.6 kg   SpO2 98%   BMI 35.45 kg/m   Physical Exam  Constitutional: She appears well-developed and well-nourished. No  distress.  HENT:  Head: Atraumatic.  Eyes: Conjunctivae are normal. No scleral icterus.  Neck: Neck supple. No tracheal deviation present.  Cardiovascular: Normal rate, regular rhythm, normal heart sounds and intact distal pulses.   Pulmonary/Chest: Effort normal and breath sounds normal. No respiratory distress. She exhibits no tenderness.  Abdominal: Soft. Normal appearance. She exhibits no distension. There is no tenderness.  Musculoskeletal: She exhibits no edema.  CTLS spine, non tender, aligned, no step off. Tenderness and mild sts right shoulder. Radial pulse 2+. No other focal bony tenderness noted on  extremity exam.   Neurological: She is alert.  Speech clear/fluent. Steady gait. Rue r/u/m fxn intact, motor 5/5, sens grossly intact.   Skin: Skin is warm and dry. No rash noted. She is not diaphoretic.  No abrasions/lacs noted.   Psychiatric: She has a normal mood and affect.  Nursing note and vitals reviewed.    ED Treatments / Results  Labs (all labs ordered are listed, but only abnormal results are displayed) Labs Reviewed - No data to display  EKG  EKG Interpretation None       Radiology Dg Shoulder Right  Result Date: 01/06/2017 CLINICAL DATA:  Right shoulder pain with limited range of motion since falling and landing on right shoulder. EXAM: RIGHT SHOULDER - 2+ VIEW COMPARISON:  None. FINDINGS: There is a comminuted and mildly displaced fracture involving the surgical neck of the right humerus. This fracture involves the greater tuberosity. There is no involvement of the articular surface of the humeral head. There is no dislocation or scapular fracture. Mild acromioclavicular degenerative changes are present. IMPRESSION: Mildly comminuted and displaced fracture of the right humeral neck, involving the greater tuberosity. Electronically Signed   By: Richardean Sale M.D.   On: 01/06/2017 10:09    Procedures Procedures (including critical care time)  Medications Ordered in ED Medications  HYDROmorphone (DILAUDID) injection 1 mg (not administered)     Initial Impression / Assessment and Plan / ED Course  I have reviewed the triage vital signs and the nursing notes.  Pertinent labs & imaging results that were available during my care of the patient were reviewed by me and considered in my medical decision making (see chart for details).  Reviewed nursing notes and prior charts for additional history.   Confirmed nkda.   Dilaudid 1 mg im. icepack to sore area.  Xrays.   Discussed xrays w pt.  Pain is improved.  Shoulder immobilizer placed.  F/u ortho in the next  few days.   Final Clinical Impressions(s) / ED Diagnoses   Final diagnoses:  None    New Prescriptions New Prescriptions   No medications on file     Lajean Saver, MD 01/06/17 1048

## 2017-01-06 NOTE — Discharge Instructions (Addendum)
It was our pleasure to provide your ER care today - we hope that you feel better.  Take motrin or aleve as need for pain. You may also take hydrocodone as need for pain. No driving for the next 6 hours or when taking hydrocodone. Also, do not take tylenol or acetaminophen containing medication when taking hydrocodone.  Icepack/cold to sore area.   Wear shoulder immobilizer for comfort and support.   Follow up with orthopedic doctor this coming week - see referral - call office this Monday AM to arrange appointment.  Your blood pressure is high today - continue your blood pressure medication, follow blood pressure friendly diet, and follow up with primary care doctor in the coming week.   Return to ER if worse, new symptoms, intractable pain, numbness/weakness, other concern.

## 2017-01-06 NOTE — ED Notes (Signed)
Patient transported to X-ray 

## 2017-01-29 ENCOUNTER — Ambulatory Visit: Payer: Medicaid Other | Attending: Orthopedic Surgery | Admitting: Physical Therapy

## 2017-01-29 DIAGNOSIS — R293 Abnormal posture: Secondary | ICD-10-CM | POA: Diagnosis present

## 2017-01-29 DIAGNOSIS — M62830 Muscle spasm of back: Secondary | ICD-10-CM | POA: Insufficient documentation

## 2017-01-29 DIAGNOSIS — M25511 Pain in right shoulder: Secondary | ICD-10-CM | POA: Insufficient documentation

## 2017-01-29 DIAGNOSIS — X58XXXD Exposure to other specified factors, subsequent encounter: Secondary | ICD-10-CM | POA: Insufficient documentation

## 2017-01-29 DIAGNOSIS — S42301D Unspecified fracture of shaft of humerus, right arm, subsequent encounter for fracture with routine healing: Secondary | ICD-10-CM

## 2017-01-29 DIAGNOSIS — M6281 Muscle weakness (generalized): Secondary | ICD-10-CM | POA: Diagnosis present

## 2017-01-29 NOTE — Therapy (Signed)
[facility], [place], [address] Phone: [phone]   Fax:  [phone]  Physical Therapy Evaluation  Patient Details  Name: Sherry Arroyo MRN: [id number] Date of Birth: 1956-01-20 Referring Provider: Victorino December Md  Encounter Date: 01/29/2017      PT End of Session - 01/29/17 1055    Visit Number 1   Number of Visits 1   Date for PT Re-Evaluation 01/30/17   PT Start Time [id number]   PT Stop Time 1057   PT Time Calculation (min) 42 min   Activity Tolerance Patient tolerated treatment well;Patient limited by pain   Behavior During Therapy [facility] for tasks assessed/performed      Past Medical History:  Diagnosis Date  . Hypertension     No past surgical history on file.  There were no vitals filed for this visit.       Subjective Assessment - 01/29/17 1022    Subjective pt is a 61 y.o F with CC of R shoulder pain and dx fracture that occure 3 weeks ago from a fall onto her shoulder. Pain stays mostly in the shoulder with referral to the inside of the elbow. been using topical ointment to loosen it up, since onset the pain has gotten better. denies N/T.    Limitations Lifting   Diagnostic tests x-ray   Patient Stated Goals to be able to lift the arm up, decrease pain   Currently in Pain? Yes   Pain Score 8   only with moving it hurts it   Pain Location Shoulder   Pain Orientation Right;Upper   Pain Descriptors / Indicators Aching;Sore   Pain Type --  sub-acute   Pain Radiating Towards Radiates to the anteiror elbow   Pain Onset 1 to 4 weeks ago   Pain Frequency Constant   Aggravating Factors  moving the shoulder around.   Pain Relieving Factors resting,             [facility] PT Assessment - 01/29/17 0001      Assessment   Medical Diagnosis s/p R humeral fx   Referring Provider Victorino December Md   Onset Date/Surgical Date --  3 weeks   Hand Dominance Right   Next MD Visit 2 weeks   Prior  Therapy no     Precautions   Precautions Shoulder   Precaution Comments don't lift anything heavy over 10#     Balance Screen   Has the patient fallen in the past 6 months Yes   How many times? 1   Has the patient had a decrease in activity level because of a fear of falling?  No   Is the patient reluctant to leave their home because of a fear of falling?  No     Home Environment   Living Environment Private residence   Living Arrangements Other relatives   Available Help at Discharge Available PRN/intermittently   Type of Home Apartment   Home Access Level entry   Home Layout One level     Prior Function   Level of Independence Independent;Independent with basic ADLs   Vocation On disability   Leisure Drink,     Cognition   Overall Cognitive Status Within Functional Limits for tasks assessed     Posture/Postural Control   Posture/Postural Control Postural limitations   Postural Limitations Rounded Shoulders;Forward head     ROM / Strength   AROM / PROM / Strength AROM;PROM;Strength     PROM  PROM Assessment Site Shoulder   Right/Left Shoulder Right   Right Shoulder Extension 54 Degrees   Right Shoulder Flexion 85 Degrees   Right Shoulder ABduction 76 Degrees   Right Shoulder Internal Rotation 56 Degrees  at 45 degrees abdct   Right Shoulder External Rotation 46 Degrees  at 45 degrees abdct     Strength   Overall Strength Due to precautions;Due to pain   Strength Assessment Site Shoulder     Palpation   Palpation comment tightness isn supraspinatus, upper trapezius, posterior deltoid, proximal bicep with palpable knot and distal bicep brachii tendon                            PT Education - 01/29/17 1055    Education provided Yes   Education Details evaluation findings, HEP with proper form and emphasis to avoid lifting exercise until released from MD. [facility] clinic handout    Person(s) Educated Patient   Methods  Explanation;Demonstration;Verbal cues;Handout   Comprehension Verbalized understanding;Verbal cues required                    Plan - 01/29/17 1106    Clinical Impression Statement Mrs. Doorley presents to OPPT as a low complexity evaluation with CC of R shoulder pain due to a humeral fx s/p falling on the shoulder 3 weeks ago. Per MD script focused on PROM which she demonstrated significant limitaiton due to pain and muscle spasm. Strength wasn't assessed due to MD precautions. Tenderness located around the shoulder with specific soreness in the  Rupper trap/ levator scap, and significant tightness in the proximal/ distal biceps brachii. Reviewed HEP and provided [facility] handout.    PT Next Visit Plan 1 x medicaid evaluation   PT Home Exercise Plan table slides flexion/ abduction, pendulums, bicep stretch, upper trap stretch, levator scap stretch, progressed from MD IR/ER and Rows   Consulted and Agree with Plan of Care Patient      Patient will benefit from skilled therapeutic intervention in order to improve the following deficits and impairments:  Pain, Improper body mechanics, Postural dysfunction, Decreased endurance, Increased fascial restricitons, Increased muscle spasms, Decreased strength, Decreased mobility, Decreased range of motion, Decreased activity tolerance, Hypomobility  Visit Diagnosis: Right shoulder pain, unspecified chronicity - Plan: PT plan of care cert/re-cert  Closed fracture of shaft of right humerus with routine healing, unspecified fracture morphology, subsequent encounter - Plan: PT plan of care cert/re-cert  Muscle weakness (generalized) - Plan: PT plan of care cert/re-cert  Abnormal posture - Plan: PT plan of care cert/re-cert  Muscle spasm of back - Plan: PT plan of care cert/re-cert     Problem List There are no active problems to display for this patient.  Starr Lake PT, DPT, LAT, ATC  01/29/17  11:16 AM      [facility] [address] [place], [place], [address] Phone: [phone]   Fax:  [phone]  Name: Sherry Arroyo MRN: [id number] Date of Birth: 05-30-1956

## 2018-01-31 IMAGING — CR DG SHOULDER 2+V*R*
2 series · 2 of 2 positions shown · non-contrast
Comparison: None.

CLINICAL DATA: Right shoulder pain with limited range of motion
since falling and landing on right shoulder.

EXAM:
RIGHT SHOULDER - 2+ VIEW

[shoulder grashey]
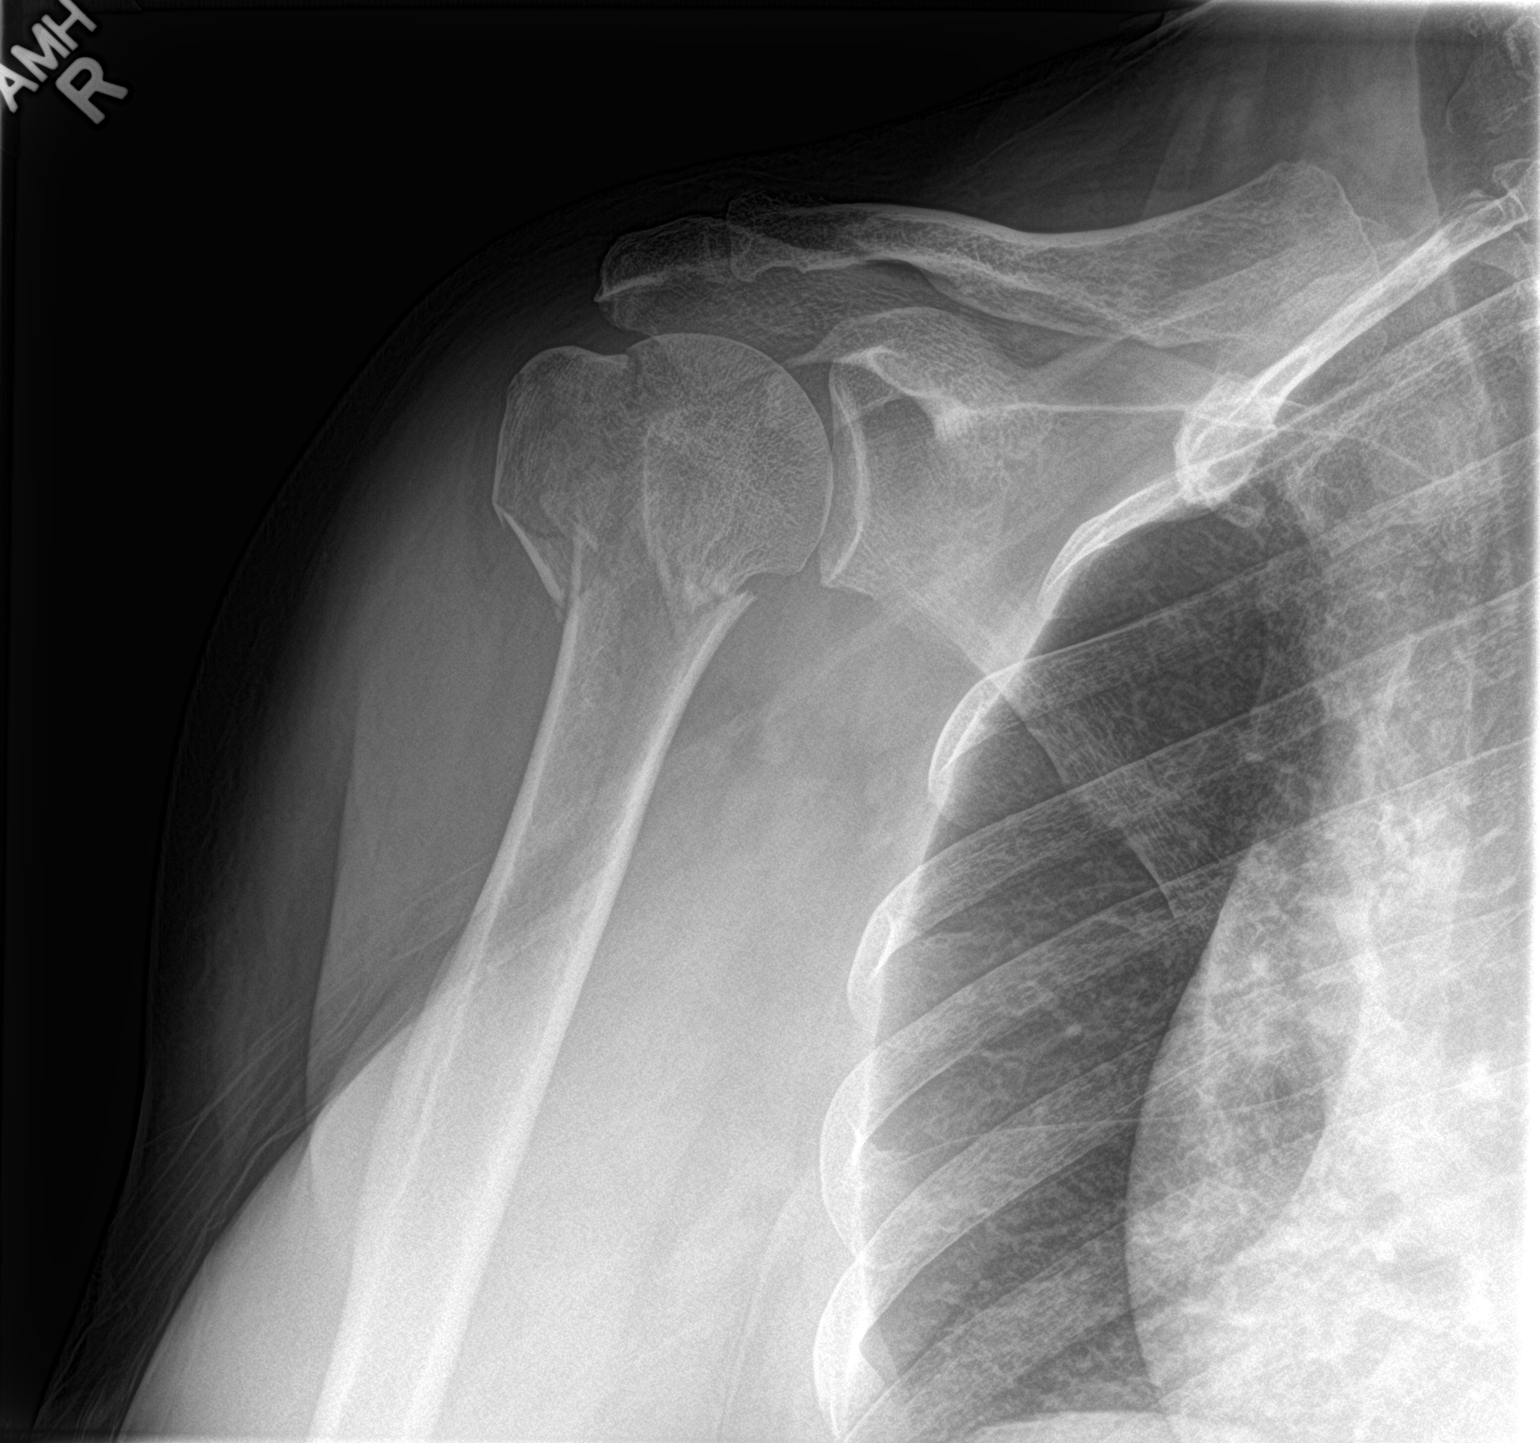

[shoulder y view]
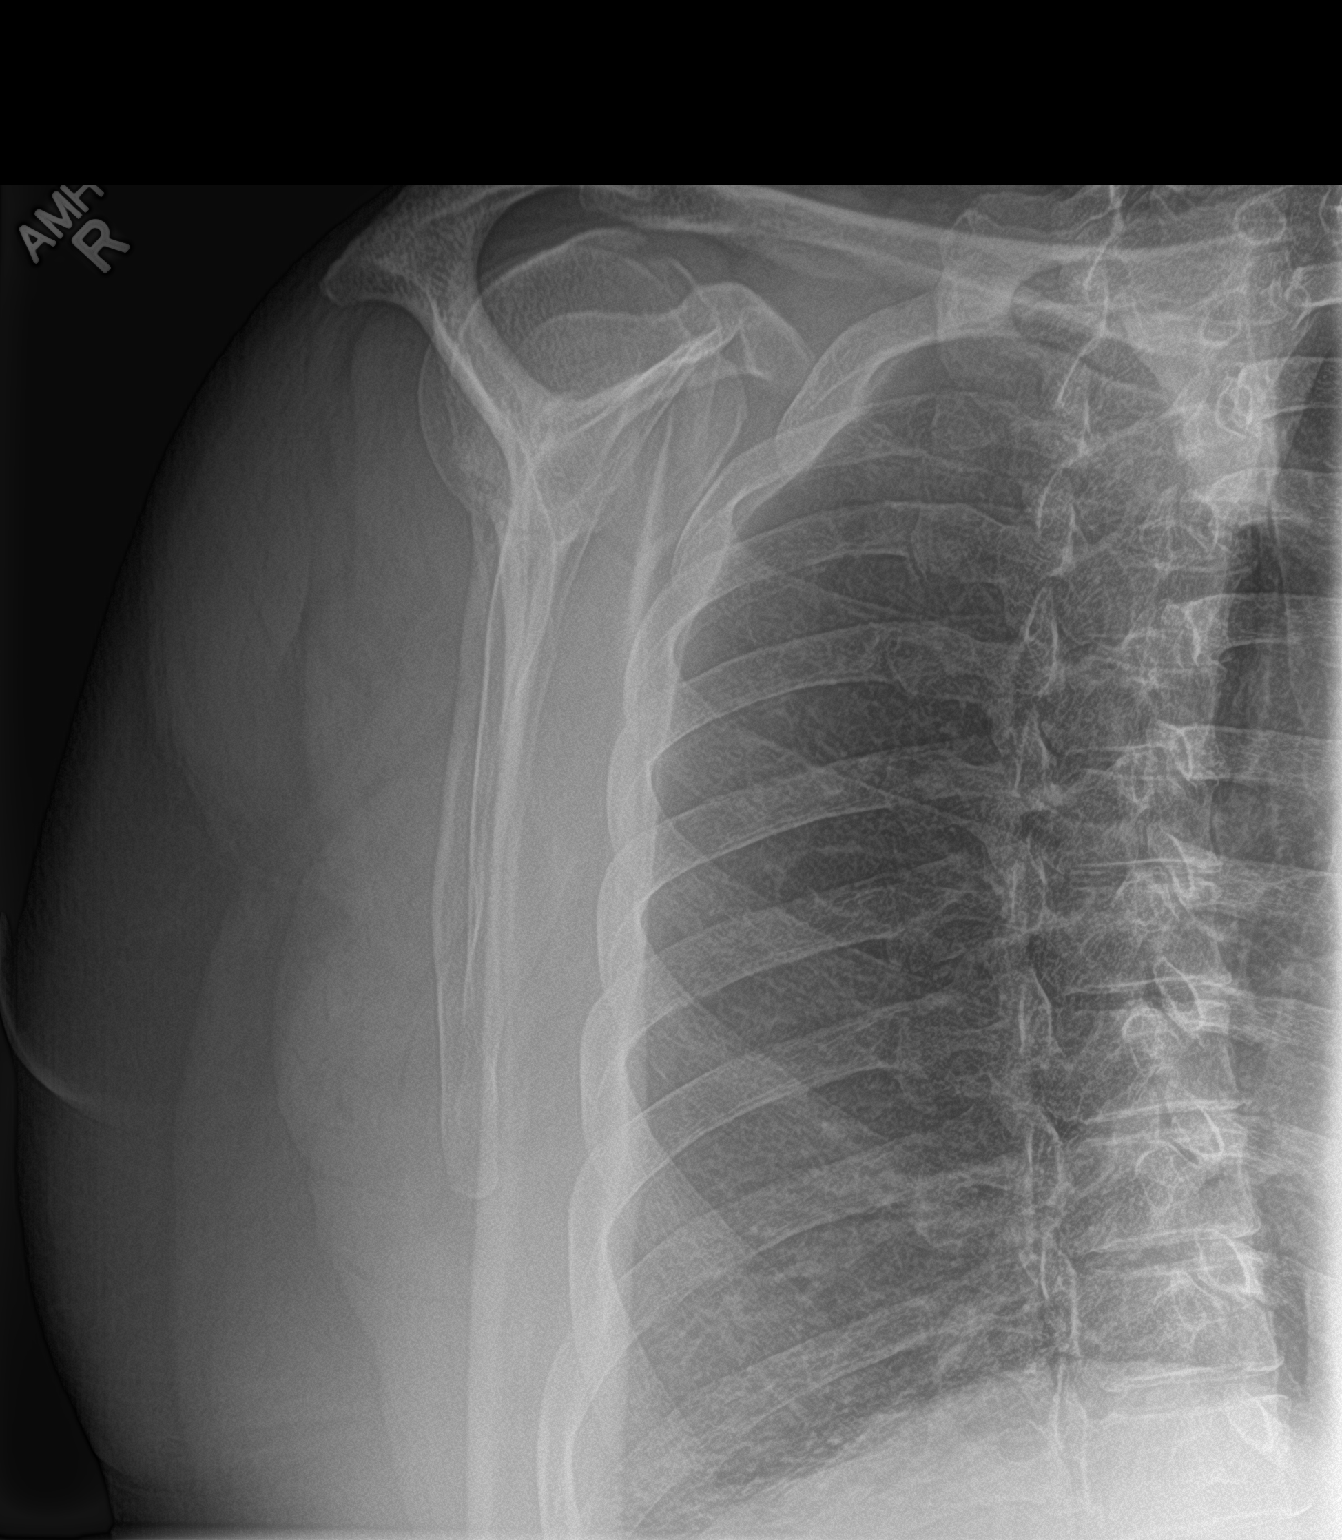

[2 of 2 positions shown; findings below may reference images not displayed]

FINDINGS: There is a comminuted and mildly displaced fracture involving the
surgical neck of the right humerus. This fracture involves the
greater tuberosity. There is no involvement of the articular surface
of the humeral head. There is no dislocation or scapular fracture.
Mild acromioclavicular degenerative changes are present.
IMPRESSION: Mildly comminuted and displaced fracture of the right humeral neck,
involving the greater tuberosity.

## 2018-05-10 ENCOUNTER — Ambulatory Visit (INDEPENDENT_AMBULATORY_CARE_PROVIDER_SITE_OTHER): Payer: Medicaid Other | Admitting: Internal Medicine

## 2018-05-10 ENCOUNTER — Encounter: Payer: Self-pay | Admitting: Internal Medicine

## 2018-05-10 VITALS — BP 158/92 | HR 68 | Ht 65.75 in | Wt 205.6 lb

## 2018-05-10 DIAGNOSIS — E559 Vitamin D deficiency, unspecified: Secondary | ICD-10-CM

## 2018-05-10 DIAGNOSIS — E213 Hyperparathyroidism, unspecified: Secondary | ICD-10-CM | POA: Diagnosis not present

## 2018-05-10 LAB — VITAMIN D 25 HYDROXY (VIT D DEFICIENCY, FRACTURES): VITD: 17.11 ng/mL — ABNORMAL LOW (ref 30.00–100.00)

## 2018-05-10 NOTE — Patient Instructions (Signed)
Please stop at the lab.  If the vitamin D is normal, We will need to have you back for further labs.  If the vitamin D is low, we will start a vitamin D supplement and check further labs when the level normalizes.   Please return in 4 months with your sugar log.   Hypercalcemia Hypercalcemia is having too much calcium in the blood. The body needs calcium to make bones and keep them strong. Calcium also helps the muscles, nerves, brain, and heart work the way they should. Most of the calcium in the body is in the bones. There is also some calcium in the blood. Hypercalcemia can happen when calcium comes out of the bones, or when the kidneys are not able to remove calcium from the blood. Hypercalcemia can be mild or severe. What are the causes? There are many possible causes of hypercalcemia. Common causes include:  Hyperparathyroidism. This is a condition in which the body produces too much parathyroid hormone. There are four parathyroid glands in your neck. These glands produce a chemical messenger (hormone) that helps the body absorb calcium from foods and helps your bones release calcium.  Certain kinds of cancer, such as lung cancer, breast cancer, or myeloma.  Less common causes of hypercalcemia include:  Getting too much calcium or vitamin D from your diet.  Kidney failure.  Hyperthyroidism.  Being on bed rest for a long time.  Certain medicines.  Infections.  Sarcoidosis.  What increases the risk? This condition is more likely to develop in:  Women.  People who are 60 years or older.  People who have a family history of hypercalcemia.  What are the signs or symptoms? Mild hypercalcemia that starts slowly may not cause symptoms. Severe, sudden hypercalcemia is more likely to cause symptoms, such as:  Loss of appetite.  Increased thirst and frequent urination.  Fatigue.  Nausea and vomiting.  Headache.  Abdominal pain.  Muscle pain, twitching, or  weakness.  Constipation.  Blood in the urine.  Pain in the side of the back (flank pain).  Anxiety, confusion, or depression.  Irregular heartbeat (arrhythmia).  Loss of consciousness.  How is this diagnosed? This condition may be diagnosed based on:  Your symptoms.  Blood tests.  Urine tests.  X-rays.  Ultrasound.  MRI.  CT scan.  How is this treated? Treatment for hypercalcemia depends on the cause. Treatment may include:  Receiving fluids through an IV tube.  Medicines that keep calcium levels steady after receiving fluids (loop diuretics).  Medicines that keep calcium in your bones (bisphosphonates).  Medicines that lower the calcium level in your blood.  Surgery to remove overactive parathyroid glands.  Follow these instructions at home:  Take over-the-counter and prescription medicines only as told by your health care provider.  Follow instructions from your health care provider about eating or drinking restrictions.  Drink enough fluid to keep your urine clear or pale yellow.  Stay active. Weight-bearing exercise helps to keep calcium in your bones. Follow instructions from your health care provider about what type and level of exercise is safe for you.  Keep all follow-up visits as told by your health care provider. This is important. Contact a health care provider if:  You have a fever.  You have flank or abdominal pain that is getting worse. Get help right away if:  You have severe abdominal or flank pain.  You have chest pain.  You have trouble breathing.  You become very confused and sleepy.  You lose consciousness. This information is not intended to replace advice given to you by your health care provider. Make sure you discuss any questions you have with your health care provider. Document Released: 02/03/2005 Document Revised: 04/27/2016 Document Reviewed: 04/07/2015 Elsevier Interactive Patient Education  2018 [employer].
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
## 2018-05-20 DIAGNOSIS — E213 Hyperparathyroidism, unspecified: Secondary | ICD-10-CM | POA: Insufficient documentation

## 2018-07-22 ENCOUNTER — Other Ambulatory Visit (INDEPENDENT_AMBULATORY_CARE_PROVIDER_SITE_OTHER): Payer: Medicaid Other

## 2018-07-22 ENCOUNTER — Other Ambulatory Visit: Payer: Self-pay | Admitting: Internal Medicine

## 2018-07-22 DIAGNOSIS — E213 Hyperparathyroidism, unspecified: Secondary | ICD-10-CM

## 2018-07-22 DIAGNOSIS — E559 Vitamin D deficiency, unspecified: Secondary | ICD-10-CM | POA: Diagnosis not present

## 2018-07-22 LAB — BASIC METABOLIC PANEL WITH GFR
BUN / CREAT RATIO: 21 (calc) (ref 6–22)
BUN: 22 mg/dL (ref 7–25)
CHLORIDE: 108 mmol/L (ref 98–110)
CO2: 25 mmol/L (ref 20–32)
Calcium: 11.2 mg/dL — ABNORMAL HIGH (ref 8.6–10.4)
Creat: 1.03 mg/dL — ABNORMAL HIGH (ref 0.50–0.99)
GFR, Est African American: 67 mL/min/{1.73_m2} (ref >=60)
GFR, Est Non African American: 58 mL/min/{1.73_m2} — ABNORMAL LOW (ref >=60)
GLUCOSE: 117 mg/dL — AB (ref 65–99)
Potassium: 4.5 mmol/L (ref 3.5–5.3)
SODIUM: 140 mmol/L (ref 135–146)

## 2018-07-22 LAB — VITAMIN D 25 HYDROXY (VIT D DEFICIENCY, FRACTURES): VITD: 30.23 ng/mL (ref 30.00–100.00)

## 2018-07-23 ENCOUNTER — Other Ambulatory Visit: Payer: Self-pay | Admitting: Internal Medicine

## 2018-07-23 DIAGNOSIS — E213 Hyperparathyroidism, unspecified: Secondary | ICD-10-CM

## 2018-07-23 NOTE — Progress Notes (Signed)
DEXA 

## 2018-07-31 ENCOUNTER — Ambulatory Visit (INDEPENDENT_AMBULATORY_CARE_PROVIDER_SITE_OTHER)
Admission: RE | Admit: 2018-07-31 | Discharge: 2018-07-31 | Disposition: A | Discharge status: Home / Self Care | Payer: Medicaid Other | Source: Ambulatory Visit | Attending: Internal Medicine | Admitting: Internal Medicine

## 2018-07-31 DIAGNOSIS — E213 Hyperparathyroidism, unspecified: Secondary | ICD-10-CM | POA: Diagnosis not present

## 2018-09-10 ENCOUNTER — Ambulatory Visit: Payer: Medicaid Other | Admitting: Internal Medicine

## 2018-09-18 ENCOUNTER — Ambulatory Visit (INDEPENDENT_AMBULATORY_CARE_PROVIDER_SITE_OTHER): Payer: Medicaid Other | Admitting: Internal Medicine

## 2018-09-18 ENCOUNTER — Encounter: Payer: Self-pay | Admitting: Internal Medicine

## 2018-09-18 VITALS — BP 120/82 | HR 90 | Ht 65.75 in | Wt 204.0 lb

## 2018-09-18 DIAGNOSIS — E559 Vitamin D deficiency, unspecified: Secondary | ICD-10-CM | POA: Diagnosis not present

## 2018-09-18 DIAGNOSIS — E213 Hyperparathyroidism, unspecified: Secondary | ICD-10-CM

## 2018-09-18 LAB — MAGNESIUM: Magnesium: 2.3 mg/dL (ref 1.5–2.5)

## 2018-09-18 LAB — VITAMIN D 25 HYDROXY (VIT D DEFICIENCY, FRACTURES): VITD: 26.52 ng/mL — ABNORMAL LOW (ref 30.00–100.00)

## 2018-09-18 LAB — PHOSPHORUS: Phosphorus: 3.2 mg/dL (ref 2.3–4.6)
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
## 2018-09-18 NOTE — Patient Instructions (Signed)
Please stop at the lab.  Please continue 5000 units vitamin D daily.  Patient information (Up-to-Date): Collection of a 24-hour urine specimen  - You should collect every drop of urine during each 24-hour period. It does not matter how much or little urine is passed each time, as long as every drop is collected. - Begin the urine collection in the morning after you wake up, after you have emptied your bladder for the first time. - Urinate (empty the bladder) for the first time and flush it down the toilet. Note the exact time (eg, 6:15 AM). You will begin the urine collection at this time. - Collect every drop of urine during the day and night in an empty collection bottle. Store the bottle at room temperature or in the refrigerator. - If you need to have a bowel movement, any urine passed with the bowel movement should be collected. Try not to include feces with the urine collection. If feces does get mixed in, do not try to remove the feces from the urine collection bottle. - Finish by collecting the first urine passed the next morning, adding it to the collection bottle. This should be within ten minutes before or after the time of the first morning void on the first day (which was flushed). In this example, you would try to void between 6:05 and 6:25 on the second day. - If you need to urinate one hour before the final collection time, drink a full glass of water so that you can void again at the appropriate time. If you have to urinate 20 minutes before, try to hold the urine until the proper time. - Please note the exact time of the final collection, even if it is not the same time as when collection began on day 1. - The bottle(s) may be kept at room temperature for a day or two, but should be kept cool or refrigerated for longer periods of time.  Please come back for a follow-up appointment in 6 months.

## 2018-09-19 LAB — PARATHYROID HORMONE, INTACT (NO CA): PTH: 63 pg/mL (ref 15–65)

## 2018-09-21 LAB — VITAMIN D 1,25 DIHYDROXY
Vitamin D 1, 25 (OH)2 Total: 55 pg/mL (ref 18–72)
Vitamin D3 1, 25 (OH)2: 55 pg/mL

## 2018-09-21 LAB — BASIC METABOLIC PANEL WITH GFR
BUN: 17 mg/dL (ref 7–25)
CALCIUM: 11.5 mg/dL — AB (ref 8.6–10.4)
CO2: 25 mmol/L (ref 20–32)
CREATININE: 0.8 mg/dL (ref 0.50–0.99)
Chloride: 104 mmol/L (ref 98–110)
GFR, EST NON AFRICAN AMERICAN: 79 mL/min/{1.73_m2} (ref >=60)
GFR, Est African American: 92 mL/min/{1.73_m2} (ref >=60)
GLUCOSE: 109 mg/dL — AB (ref 65–99)
Potassium: 5 mmol/L (ref 3.5–5.3)
Sodium: 138 mmol/L (ref 135–146)

## 2018-09-24 ENCOUNTER — Telehealth: Payer: Self-pay

## 2018-09-24 NOTE — Telephone Encounter (Signed)
Notified patient of message from Dr. Cruzita Lederer, patient expressed understanding and agreement. No further questions.  Patient had been taking 2000, she will increase to 5000

## 2018-09-24 NOTE — Telephone Encounter (Signed)
-----   Message from Philemon Kingdom, MD sent at 09/23/2018  8:05 AM EDT ----- Sherry Arroyo, can you please call pt: Calcium is still high, with a PTH higher than expected and a slightly low vitamin D.  Please make sure that she is taking her 5000 units vitamin D every day.  Magnesium and phosphorus were normal.  Active vitamin D level was also normal. I placed another referral to see Dr. Harlow Asa.  They will call her with the appointment.

## 2018-09-30 ENCOUNTER — Other Ambulatory Visit (INDEPENDENT_AMBULATORY_CARE_PROVIDER_SITE_OTHER): Payer: Medicaid Other

## 2018-09-30 DIAGNOSIS — E213 Hyperparathyroidism, unspecified: Secondary | ICD-10-CM

## 2018-10-01 ENCOUNTER — Other Ambulatory Visit: Payer: Self-pay

## 2018-10-01 LAB — CREATININE, URINE, 24 HOUR: Creatinine, 24H Ur: 1.5 g/(24.h) (ref 0.50–2.15)

## 2018-10-01 LAB — CALCIUM, URINE, 24 HOUR: Calcium, 24H Urine: 122 mg/24 h

## 2018-10-03 ENCOUNTER — Telehealth: Payer: Self-pay

## 2018-10-03 NOTE — Telephone Encounter (Signed)
Called patient, phone rang several times, no answer or VM. Will try again later.

## 2018-10-03 NOTE — Telephone Encounter (Signed)
-----   Message from Philemon Kingdom, MD sent at 10/02/2018  9:28 AM EDT ----- Lenna Sciara, can you please call pt: Urine calcium is normal. I would still suggest to see Dr. Harlow Asa for her parathyroid disease. Was she called to schedule the appt?

## 2018-10-10 ENCOUNTER — Other Ambulatory Visit (HOSPITAL_COMMUNITY): Payer: Self-pay | Admitting: Surgery

## 2018-10-10 DIAGNOSIS — E210 Primary hyperparathyroidism: Secondary | ICD-10-CM

## 2018-10-10 DIAGNOSIS — Z8639 Personal history of other endocrine, nutritional and metabolic disease: Secondary | ICD-10-CM

## 2018-10-18 ENCOUNTER — Ambulatory Visit (HOSPITAL_COMMUNITY)
Admission: RE | Admit: 2018-10-18 | Discharge: 2018-10-18 | Disposition: A | Discharge status: Home / Self Care | Payer: Medicaid Other | Source: Ambulatory Visit | Attending: Surgery | Admitting: Surgery

## 2018-10-18 ENCOUNTER — Encounter (HOSPITAL_COMMUNITY)
Admission: RE | Admit: 2018-10-18 | Discharge: 2018-10-18 | Disposition: A | Discharge status: Home / Self Care | Payer: Medicaid Other | Source: Ambulatory Visit | Attending: Surgery | Admitting: Surgery

## 2018-10-18 DIAGNOSIS — Z8639 Personal history of other endocrine, nutritional and metabolic disease: Secondary | ICD-10-CM

## 2018-10-18 DIAGNOSIS — E210 Primary hyperparathyroidism: Secondary | ICD-10-CM | POA: Insufficient documentation

## 2018-10-18 MED ORDER — TECHNETIUM TC 99M SESTAMIBI GENERIC - CARDIOLITE
25.0000 | Freq: Once | INTRAVENOUS | Status: AC | PRN
  Administered 2018-10-18: 25 via INTRAVENOUS

## 2018-10-29 ENCOUNTER — Ambulatory Visit: Payer: Self-pay | Admitting: Surgery

## 2018-11-22 ENCOUNTER — Encounter (HOSPITAL_COMMUNITY): Payer: Self-pay

## 2018-11-22 NOTE — Patient Instructions (Signed)
GRACEN RINGWALD  11/22/2018   Your procedure is scheduled on:   12-05-2018   Report to [facility] Main  Entrance,  Report to admitting at  8:30 AM    Call this number if you have problems the morning of surgery [phone]      Remember: Do not eat food or drink liquids :After Midnight.                                       BRUSH YOUR TEETH MORNING OF SURGERY AND RINSE YOUR MOUTH OUT, NO CHEWING GUM CANDY OR MINTS.       Take these medicines the morning of surgery with A SIP OF WATER:   Metoprolol,  Amlodipine,  Pravastatin                                 You may not have any metal on your body including hair pins and               piercings  Do not wear jewelry, make-up, lotions, powders or perfumes, deodorant              Do not wear nail polish.  Do not shave  48 hours prior to surgery.                  Do not bring valuables to the hospital. [place].  Contacts, dentures or bridgework may not be worn into surgery.  Leave suitcase in the car. After surgery it may be brought to your room.     Patients discharged the day of surgery will not be allowed to drive home.  Name and phone number of your driver:    Special Instructions: N/A              Please read over the following fact sheets you were given: _____________________________________________________________________             [facility] - Preparing for Surgery Before surgery, you can play an important role.  Because skin is not sterile, your skin needs to be as free of germs as possible.  You can reduce the number of germs on your skin by washing with CHG (chlorahexidine gluconate) soap before surgery.  CHG is an antiseptic cleaner which kills germs and bonds with the skin to continue killing germs even after washing. Please DO NOT use if you have an allergy to CHG or antibacterial soaps.  If your skin becomes reddened/irritated  stop using the CHG and inform your nurse when you arrive at Short Stay. Do not shave (including legs and underarms) for at least 48 hours prior to the first CHG shower.  You may shave your face/neck. Please follow these instructions carefully:  1.  Shower with CHG Soap the night before surgery and the  morning of Surgery.  2.  If you choose to wash your hair, wash your hair first as usual with your  normal  shampoo.  3.  After you shampoo, rinse your hair and body thoroughly to remove the  shampoo.  4.  Use CHG as you would any other liquid soap.  You can apply chg directly  to the skin and wash                       Gently with a scrungie or clean washcloth.  5.  Apply the CHG Soap to your body ONLY FROM THE NECK DOWN.   Do not use on face/ open                           Wound or open sores. Avoid contact with eyes, ears mouth and genitals (private parts).                       Wash face,  Genitals (private parts) with your normal soap.             6.  Wash thoroughly, paying special attention to the area where your surgery  will be performed.  7.  Thoroughly rinse your body with warm water from the neck down.  8.  DO NOT shower/wash with your normal soap after using and rinsing off  the CHG Soap.             9.  Pat yourself dry with a clean towel.            10.  Wear clean pajamas.            11.  Place clean sheets on your bed the night of your first shower and do not  sleep with pets. Day of Surgery : Do not apply any lotions/deodorants the morning of surgery.  Please wear clean clothes to the hospital/surgery center.  FAILURE TO FOLLOW THESE INSTRUCTIONS MAY RESULT IN THE CANCELLATION OF YOUR SURGERY PATIENT SIGNATURE_________________________________  NURSE SIGNATURE__________________________________  ________________________________________________________________________

## 2018-11-25 ENCOUNTER — Encounter (HOSPITAL_COMMUNITY)
Admission: RE | Admit: 2018-11-25 | Discharge: 2018-11-25 | Disposition: A | Discharge status: Home / Self Care | Payer: Medicaid Other | Source: Ambulatory Visit | Attending: Surgery | Admitting: Surgery

## 2018-11-25 ENCOUNTER — Other Ambulatory Visit: Payer: Self-pay

## 2018-11-25 ENCOUNTER — Encounter (HOSPITAL_COMMUNITY): Payer: Self-pay

## 2018-11-25 DIAGNOSIS — E210 Primary hyperparathyroidism: Secondary | ICD-10-CM | POA: Diagnosis not present

## 2018-11-25 DIAGNOSIS — Z01818 Encounter for other preprocedural examination: Secondary | ICD-10-CM | POA: Insufficient documentation

## 2018-11-25 DIAGNOSIS — R9431 Abnormal electrocardiogram [ECG] [EKG]: Secondary | ICD-10-CM | POA: Insufficient documentation

## 2018-11-25 HISTORY — DX: Primary hyperparathyroidism: E21.0

## 2018-11-25 HISTORY — DX: Dyspnea, unspecified: R06.00

## 2018-11-25 HISTORY — DX: Vitamin D deficiency, unspecified: E55.9

## 2018-11-25 HISTORY — DX: Hypercalcemia: E83.52

## 2018-11-25 HISTORY — DX: Chronic kidney disease, stage 2 (mild): N18.2

## 2018-11-25 LAB — CBC
HCT: 41.4 % (ref 36.0–46.0)
Hemoglobin: 12.8 g/dL (ref 12.0–15.0)
MCH: 29.4 pg (ref 26.0–34.0)
MCHC: 30.9 g/dL (ref 30.0–36.0)
MCV: 95 fL (ref 80.0–100.0)
Platelets: 230 10*3/uL (ref 150–400)
RBC: 4.36 MIL/uL (ref 3.87–5.11)
RDW: 15.9 % — ABNORMAL HIGH (ref 11.5–15.5)
WBC: 7 10*3/uL (ref 4.0–10.5)
nRBC: 0 % (ref 0.0–0.2)

## 2018-11-25 LAB — BASIC METABOLIC PANEL
ANION GAP: 7 (ref 5–15)
BUN: 15 mg/dL (ref 8–23)
CALCIUM: 11.1 mg/dL — AB (ref 8.9–10.3)
CO2: 26 mmol/L (ref 22–32)
CREATININE: 0.84 mg/dL (ref 0.44–1.00)
Chloride: 106 mmol/L (ref 98–111)
GFR calc Af Amer: >60 mL/min (ref >60)
GFR calc non Af Amer: >60 mL/min (ref >60)
GLUCOSE: 117 mg/dL — AB (ref 70–99)
Potassium: 4.6 mmol/L (ref 3.5–5.1)
Sodium: 139 mmol/L (ref 135–145)

## 2018-11-25 NOTE — Progress Notes (Signed)
Spoke with [place] triage nurse at [facility] [place] [facility]  Regarding pt. Has no ride home day of surgery. Armen said she would send surgeon  A message

## 2018-12-01 ENCOUNTER — Encounter (HOSPITAL_COMMUNITY): Payer: Self-pay | Admitting: Surgery

## 2018-12-01 NOTE — H&P (Signed)
General Surgery [facility]  Sherry Arroyo DOB: 09-Mar-1956 Single / Language: Cleophus Molt / Race: Black or African American Female   History of Present Illness   The patient is a 62 year old female who presents with primary hyperparathyroidism.  CHIEF COMPLAINT: recurrent primary hyperparathyroidism  Patient returns for follow-up. She underwent a nuclear medicine parathyroid scan as well as an ultrasound examination of the neck. Both of these studies confirmed a right inferior parathyroid adenoma. Patient returns today to review these results and to discuss surgery.   Allergies  No Known Drug Allergies [10/08/2018]: Allergies Reconciled   Medication History  amLODIPine Besylate (10MG  Tablet, Oral) Active. Losartan Potassium (100MG  Tablet, Oral) Active. Metoprolol Tartrate (25MG  Tablet, Oral) Active. Pravastatin Sodium (40MG  Tablet, Oral) Active. Medications Reconciled  Vitals  Weight: 207.5 lb Height: 65in Body Surface Area: 2.01 m Body Mass Index: 34.53 kg/m  Temp.: 98.47F  Pulse: 70 (Regular)  BP: 132/82 (Sitting, Left Arm, Standard)  Physical Exam The physical exam findings are as follows: Note:Limited examination  Previous surgical incision is consistent with a standard thyroid incision on the anterior neck. It is well-healed. Patient does have mild raspiness to her voice. She states that she did not have any voice changes following her last procedure.  Palpation of the neck shows no palpable abnormality.    Assessment & Plan   PRIMARY HYPERPARATHYROIDISM (E21.0) HISTORY OF PRIMARY HYPERPARATHYROIDISM (Z86.39)  Patient returns today to discuss results from diagnostic testing. Nuclear medicine parathyroid scan localizes a right inferior parathyroid adenoma. Likewise the ultrasound examination confirms a 1.5 cm hypoechoic mass consistent with parathyroid adenoma in the right inferior position. We reviewed these reports  together today and the patient is provided with copies.  I recommended proceeding with minimally invasive right inferior parathyroidectomy. This would be an outpatient surgical procedure. We discussed the risk and benefits of surgery including the potential for injury to recurrent laryngeal nerves. We discussed the hospital stay as well as her postoperative recovery. Patient understands and wishes to proceed with surgery in the near future.  The risks and benefits of the procedure have been discussed at length with the patient. The patient understands the proposed procedure, potential alternative treatments, and the course of recovery to be expected. All of the patient's questions have been answered at this time. The patient wishes to proceed with surgery.  COMPLETE H&P NOTE:  Sherry Arroyo Documented: 10/08/2018 9:03 AM Location: [facility] Patient #: [id number] DOB: January 13, 1956 Single / Language: Cleophus Molt / Race: Black or African American Female   History of Present Illness Sherry Regal MD; 10/08/2018 9:24 AM) The patient is a 62 year old female who presents with primary hyperparathyroidism.  CHIEF COMPLAINT: recurrent primary hyperparathyroidism  Patient is referred by Dr. Philemon Kingdom for surgical evaluation and management of suspected recurrent primary hyperparathyroidism. Patient has a history of primary hyperparathyroidism. She underwent nuclear medicine parathyroid scan in October 2006. This demonstrated a left superior parathyroid adenoma. She went to surgery on November 03, 2005 and underwent minimally invasive parathyroidectomy by Dr. Kathrin Penner. He removed the left superior parathyroid gland. Patient now has developed recurrent hypercalcemia. Most recent calcium level was elevated at 11.5. Patient has had multiple elevated intact PTH levels ranging from 63-168. Vitamin D level was mildly low at 26.5. Patient states she is currently taking a  vitamin D supplement. Patient did have a 24-hour urine collection for calcium which was normal at 122. Patient had a recent bone density scan which showed evidence  of osteopenia. Patient complains of chronic fatigue. She denies nephrolithiasis. Patient has had no recent imaging studies. She presents today for evaluation of recurrent primary hyperparathyroidism.   Diagnostic Studies History (Tanisha A. Owens Shark, [place]; 10/08/2018 9:03 AM) Colonoscopy  1-5 years ago  Allergies (Tanisha A. Owens Shark, [place]; 10/08/2018 9:04 AM) No Known Drug Allergies [10/08/2018]: Allergies Reconciled   Medication History (Tanisha A. Owens Shark, [place]; 10/08/2018 9:04 AM) amLODIPine Besylate (10MG  Tablet, Oral) Active. Losartan Potassium (100MG  Tablet, Oral) Active. Metoprolol Tartrate (25MG  Tablet, Oral) Active. Pravastatin Sodium (40MG  Tablet, Oral) Active. Medications Reconciled  Social History (Tanisha A. Owens Shark, [place]; 10/08/2018 9:03 AM) Alcohol use  Occasional alcohol use. Tobacco use  Former smoker.  Family History (Tanisha A. Owens Shark, [place]; 10/08/2018 9:03 AM) Hypertension  Mother.  Pregnancy / Birth History (Tanisha A. Owens Shark, [place]; 10/08/2018 9:03 AM) Age of menopause  <44 Irregular periods   Other Problems (Tanisha A. Owens Shark, [place]; 10/08/2018 9:03 AM) High blood pressure  Hypercholesterolemia     Review of Systems (Tanisha A. Brown RMA; 10/08/2018 9:03 AM) HEENT Not Present- Earache, Hearing Loss, Hoarseness, Nose Bleed, Oral Ulcers, Ringing in the Ears, Seasonal Allergies, Sinus Pain, Sore Throat, Visual Disturbances, Wears glasses/contact lenses and Yellow Eyes. Respiratory Present- Chronic Cough. Not Present- Bloody sputum, Difficulty Breathing, Snoring and Wheezing. Breast Not Present- Breast Mass, Breast Pain, Nipple Discharge and Skin Changes. Cardiovascular Not Present- Chest Pain, Difficulty Breathing Lying Down, Leg Cramps, Palpitations, Rapid Heart Rate, Shortness of Breath and Swelling of  Extremities. Gastrointestinal Not Present- Abdominal Pain, Bloating, Bloody Stool, Change in Bowel Habits, Chronic diarrhea, Constipation, Difficulty Swallowing, Excessive gas, Gets full quickly at meals, Hemorrhoids, Indigestion, Nausea, Rectal Pain and Vomiting. Musculoskeletal Not Present- Back Pain, Joint Pain, Joint Stiffness, Muscle Pain, Muscle Weakness and Swelling of Extremities. Neurological Not Present- Decreased Memory, Fainting, Headaches, Numbness, Seizures, Tingling, Tremor, Trouble walking and Weakness. Psychiatric Not Present- Anxiety, Bipolar, Change in Sleep Pattern, Depression, Fearful and Frequent crying. Endocrine Not Present- Cold Intolerance, Excessive Hunger, Hair Changes, Heat Intolerance, Hot flashes and New Diabetes. Hematology Not Present- Blood Thinners, Easy Bruising, Excessive bleeding, Gland problems, HIV and Persistent Infections.  Vitals (Tanisha A. Brown RMA; 10/08/2018 9:04 AM) 10/08/2018 9:04 AM Weight: 207.2 lb Height: 65in Body Surface Area: 2.01 m Body Mass Index: 34.48 kg/m  Temp.: 98.17F  Pulse: 10 (Regular)  BP: 128/82 (Sitting, Left Arm, Standard)       Physical Exam Sherry Regal MD; 10/08/2018 9:24 AM) The physical exam findings are as follows: Note:See vital signs recorded above  GENERAL APPEARANCE Development: normal Nutritional status: normal Gross deformities: none  SKIN Rash, lesions, ulcers: none Induration, erythema: none Nodules: none palpable  EYES Conjunctiva and lids: normal Pupils: equal and reactive Iris: normal bilaterally  EARS, NOSE, MOUTH, THROAT External ears: no lesion or deformity External nose: no lesion or deformity Hearing: grossly normal Lips: no lesion or deformity Dentition: normal for age Oral mucosa: moist  NECK Symmetric: yes Trachea: midline Thyroid: no palpable nodules in the thyroid bed Well-healed anterior cervical incision.  CHEST Respiratory effort: normal Retraction  or accessory muscle use: no Breath sounds: normal bilaterally Rales, rhonchi, wheeze: none  CARDIOVASCULAR Auscultation: regular rhythm, normal rate Murmurs: none Pulses: carotid and radial pulse 2+ palpable Lower extremity edema: none Lower extremity varicosities: none  MUSCULOSKELETAL Station and gait: normal Digits and nails: no clubbing or cyanosis Muscle strength: grossly normal all extremities Range of motion: grossly normal all extremities Deformity: none  LYMPHATIC Cervical: none palpable Supraclavicular: none palpable  PSYCHIATRIC  Oriented to person, place, and time: yes Mood and affect: normal for situation Judgment and insight: appropriate for situation    Assessment & Plan Sherry Regal MD; 10/08/2018 9:26 AM) PRIMARY HYPERPARATHYROIDISM (E21.0) HISTORY OF PRIMARY HYPERPARATHYROIDISM (Z86.39) Current Plans Follow Up - Call [facility] office after tests / studies doneto discuss further plans  Patient is referred by her endocrinologist for evaluation of what appears to be recurrent primary hyperparathyroidism. Patient had been diagnosed previously in 2006. She had a positive imaging study and underwent parathyroidectomy by one of my retired partner's.  Patient now has biochemical evidence of recurrent primary hyperparathyroidism. She has not had any new imaging studies performed. We will therefore obtain an ultrasound examination of the neck to evaluate the thyroid and parathyroid glands. We will also obtain a nuclear medicine parathyroid scan in hopes of localizing parathyroid adenoma.  Patient will undergo these studies and then return to the office to discuss the results and plan for further management.  Armandina Gemma, [facility] Office: [phone]

## 2018-12-04 NOTE — Anesthesia Preprocedure Evaluation (Addendum)
Anesthesia Evaluation  Patient identified by MRN, date of birth, ID band Patient awake    Reviewed: Allergy & Precautions, NPO status , Patient's Chart, lab work & pertinent test results  Airway Mallampati: II  TM Distance: >3 FB Neck ROM: Full    Dental no notable dental hx. (+) Partial Upper, Dental Advisory Given,    Pulmonary Current Smoker,    Pulmonary exam normal breath sounds clear to auscultation       Cardiovascular hypertension, Pt. on medications and Pt. on home beta blockers Normal cardiovascular exam Rhythm:Regular Rate:Normal     Neuro/Psych negative neurological ROS  negative psych ROS   GI/Hepatic   Endo/Other    Renal/GU Renal disease     Musculoskeletal negative musculoskeletal ROS (+)   Abdominal (+) + obese,   Peds  Hematology negative hematology ROS (+)   Anesthesia Other Findings   Reproductive/Obstetrics                            Lab Results  Component Value Date   WBC 7.0 11/25/2018   HGB 12.8 11/25/2018   HCT 41.4 11/25/2018   MCV 95.0 11/25/2018   PLT 230 11/25/2018    Anesthesia Physical Anesthesia Plan  ASA: III  Anesthesia Plan: General   Post-op Pain Management:    Induction: Intravenous  PONV Risk Score and Plan: 3 and Treatment may vary due to age or medical condition, Ondansetron and Dexamethasone  Airway Management Planned: Oral ETT  Additional Equipment:   Intra-op Plan:   Post-operative Plan: Extubation in OR  Informed Consent: I have reviewed the patients History and Physical, chart, labs and discussed the procedure including the risks, benefits and alternatives for the proposed anesthesia with the patient or authorized representative who has indicated his/her understanding and acceptance.   Dental advisory given  Plan Discussed with: CRNA  Anesthesia Plan Comments:         Anesthesia Quick Evaluation

## 2018-12-05 ENCOUNTER — Encounter (HOSPITAL_COMMUNITY): Payer: Self-pay | Admitting: *Deleted

## 2018-12-05 ENCOUNTER — Ambulatory Visit (HOSPITAL_COMMUNITY): Payer: Medicaid Other | Admitting: Anesthesiology

## 2018-12-05 ENCOUNTER — Encounter (HOSPITAL_COMMUNITY)
Admission: RE | Disposition: A | Discharge status: Home / Self Care | Payer: Self-pay | Source: Ambulatory Visit | Attending: Surgery

## 2018-12-05 ENCOUNTER — Ambulatory Visit (HOSPITAL_COMMUNITY)
Admission: RE | Admit: 2018-12-05 | Discharge: 2018-12-05 | Disposition: A | Discharge status: Home / Self Care | Payer: Medicaid Other | Source: Ambulatory Visit | Attending: Surgery | Admitting: Surgery

## 2018-12-05 DIAGNOSIS — Z79899 Other long term (current) drug therapy: Secondary | ICD-10-CM | POA: Insufficient documentation

## 2018-12-05 DIAGNOSIS — Z87891 Personal history of nicotine dependence: Secondary | ICD-10-CM | POA: Diagnosis not present

## 2018-12-05 DIAGNOSIS — I10 Essential (primary) hypertension: Secondary | ICD-10-CM | POA: Insufficient documentation

## 2018-12-05 DIAGNOSIS — E7800 Pure hypercholesterolemia, unspecified: Secondary | ICD-10-CM | POA: Diagnosis not present

## 2018-12-05 DIAGNOSIS — E210 Primary hyperparathyroidism: Secondary | ICD-10-CM | POA: Diagnosis not present

## 2018-12-05 DIAGNOSIS — D351 Benign neoplasm of parathyroid gland: Secondary | ICD-10-CM | POA: Diagnosis not present

## 2018-12-05 DIAGNOSIS — E213 Hyperparathyroidism, unspecified: Secondary | ICD-10-CM | POA: Diagnosis present

## 2018-12-05 HISTORY — PX: PARATHYROIDECTOMY: SHX19

## 2018-12-05 SURGERY — PARATHYROIDECTOMY
Anesthesia: General | Site: Neck | Laterality: Right

## 2018-12-05 MED ORDER — ROCURONIUM BROMIDE 100 MG/10ML IV SOLN
INTRAVENOUS | Status: DC | PRN
  Administered 2018-12-05: 10 mg via INTRAVENOUS
  Administered 2018-12-05: 40 mg via INTRAVENOUS

## 2018-12-05 MED ORDER — LABETALOL HCL 5 MG/ML IV SOLN
INTRAVENOUS | Status: AC
  Filled 2018-12-05: qty 4

## 2018-12-05 MED ORDER — DEXAMETHASONE SODIUM PHOSPHATE 10 MG/ML IJ SOLN
INTRAMUSCULAR | Status: DC | PRN
  Administered 2018-12-05: 10 mg via INTRAVENOUS

## 2018-12-05 MED ORDER — FENTANYL CITRATE (PF) 100 MCG/2ML IJ SOLN
INTRAMUSCULAR | Status: AC
  Filled 2018-12-05: qty 2

## 2018-12-05 MED ORDER — CHLORHEXIDINE GLUCONATE CLOTH 2 % EX PADS: 6.0000 | MEDICATED_PAD | Freq: Once | CUTANEOUS | Status: DC

## 2018-12-05 MED ORDER — TRAMADOL HCL 50 MG PO TABS: 50.0000 mg | ORAL_TABLET | Freq: Four times a day (QID) | ORAL | 0 refills | Status: DC | PRN

## 2018-12-05 MED ORDER — LIDOCAINE HCL (CARDIAC) PF 100 MG/5ML IV SOSY
PREFILLED_SYRINGE | INTRAVENOUS | Status: DC | PRN
  Administered 2018-12-05: 80 mg via INTRAVENOUS

## 2018-12-05 MED ORDER — FENTANYL CITRATE (PF) 100 MCG/2ML IJ SOLN: 25.0000 ug | INTRAMUSCULAR | Status: DC | PRN

## 2018-12-05 MED ORDER — SUGAMMADEX SODIUM 500 MG/5ML IV SOLN
INTRAVENOUS | Status: DC | PRN
  Administered 2018-12-05: 300 mg via INTRAVENOUS

## 2018-12-05 MED ORDER — 0.9 % SODIUM CHLORIDE (POUR BTL) OPTIME
TOPICAL | Status: DC | PRN
  Administered 2018-12-05: 1000 mL

## 2018-12-05 MED ORDER — FENTANYL CITRATE (PF) 100 MCG/2ML IJ SOLN
INTRAMUSCULAR | Status: DC | PRN
  Administered 2018-12-05: 100 ug via INTRAVENOUS
  Administered 2018-12-05: 50 ug via INTRAVENOUS
  Administered 2018-12-05: 100 ug via INTRAVENOUS

## 2018-12-05 MED ORDER — ACETAMINOPHEN 10 MG/ML IV SOLN
1000.0000 mg | Freq: Once | INTRAVENOUS | Status: DC | PRN
  Administered 2018-12-05: 1000 mg via INTRAVENOUS

## 2018-12-05 MED ORDER — SUGAMMADEX SODIUM 200 MG/2ML IV SOLN
INTRAVENOUS | Status: AC
  Filled 2018-12-05: qty 2

## 2018-12-05 MED ORDER — HYDRALAZINE HCL 20 MG/ML IJ SOLN
INTRAMUSCULAR | Status: AC
  Filled 2018-12-05: qty 1

## 2018-12-05 MED ORDER — ONDANSETRON HCL 4 MG/2ML IJ SOLN: 4.0000 mg | Freq: Once | INTRAMUSCULAR | Status: DC | PRN

## 2018-12-05 MED ORDER — LABETALOL HCL 5 MG/ML IV SOLN
10.0000 mg | Freq: Once | INTRAVENOUS | Status: AC
  Administered 2018-12-05: 10 mg via INTRAVENOUS

## 2018-12-05 MED ORDER — BUPIVACAINE HCL (PF) 0.25 % IJ SOLN
INTRAMUSCULAR | Status: AC
  Filled 2018-12-05: qty 30

## 2018-12-05 MED ORDER — PROPOFOL 10 MG/ML IV BOLUS
INTRAVENOUS | Status: AC
  Filled 2018-12-05: qty 20

## 2018-12-05 MED ORDER — BUPIVACAINE HCL 0.25 % IJ SOLN
INTRAMUSCULAR | Status: DC | PRN
  Administered 2018-12-05: 10 mL

## 2018-12-05 MED ORDER — PROPOFOL 10 MG/ML IV BOLUS
INTRAVENOUS | Status: DC | PRN
  Administered 2018-12-05: 30 mg via INTRAVENOUS
  Administered 2018-12-05: 130 mg via INTRAVENOUS
  Administered 2018-12-05: 40 mg via INTRAVENOUS

## 2018-12-05 MED ORDER — TRAMADOL HCL 50 MG PO TABS: 50.0000 mg | ORAL_TABLET | Freq: Once | ORAL | Status: DC

## 2018-12-05 MED ORDER — MIDAZOLAM HCL 2 MG/2ML IJ SOLN
INTRAMUSCULAR | Status: AC
  Filled 2018-12-05: qty 2

## 2018-12-05 MED ORDER — ACETAMINOPHEN 10 MG/ML IV SOLN
INTRAVENOUS | Status: AC
  Administered 2018-12-05: 1000 mg via INTRAVENOUS
  Filled 2018-12-05: qty 100

## 2018-12-05 MED ORDER — HYDRALAZINE HCL 20 MG/ML IJ SOLN
10.0000 mg | Freq: Once | INTRAMUSCULAR | Status: AC
  Administered 2018-12-05: 10 mg via INTRAVENOUS

## 2018-12-05 MED ORDER — FENTANYL CITRATE (PF) 100 MCG/2ML IJ SOLN
25.0000 ug | INTRAMUSCULAR | Status: DC | PRN
  Administered 2018-12-05 (×2): 25 ug via INTRAVENOUS

## 2018-12-05 MED ORDER — LACTATED RINGERS IV SOLN
INTRAVENOUS | Status: DC
  Administered 2018-12-05: 09:00:00 via INTRAVENOUS

## 2018-12-05 MED ORDER — TRAMADOL HCL 50 MG PO TABS
ORAL_TABLET | ORAL | Status: AC
  Filled 2018-12-05: qty 1

## 2018-12-05 MED ORDER — ONDANSETRON HCL 4 MG/2ML IJ SOLN
INTRAMUSCULAR | Status: DC | PRN
  Administered 2018-12-05: 4 mg via INTRAVENOUS

## 2018-12-05 MED ORDER — MIDAZOLAM HCL 5 MG/5ML IJ SOLN
INTRAMUSCULAR | Status: DC | PRN
  Administered 2018-12-05 (×2): 1 mg via INTRAVENOUS

## 2018-12-05 MED ORDER — CEFAZOLIN SODIUM-DEXTROSE 2-4 GM/100ML-% IV SOLN
2.0000 g | INTRAVENOUS | Status: AC
  Administered 2018-12-05: 2 g via INTRAVENOUS
  Filled 2018-12-05: qty 100

## 2018-12-05 SURGICAL SUPPLY — 29 items
ADH SKN CLS APL DERMABOND .7 (GAUZE/BANDAGES/DRESSINGS)
ATTRACTOMAT 16X20 MAGNETIC DRP (DRAPES) ×2 IMPLANT
BLADE SURG 15 STRL LF DISP TIS (BLADE) ×1 IMPLANT
BLADE SURG 15 STRL SS (BLADE) ×2
CHLORAPREP W/TINT 26ML (MISCELLANEOUS) ×2 IMPLANT
CLIP VESOCCLUDE MED 6/CT (CLIP) ×4 IMPLANT
CLIP VESOCCLUDE SM WIDE 6/CT (CLIP) ×4 IMPLANT
COVER SURGICAL LIGHT HANDLE (MISCELLANEOUS) ×2 IMPLANT
COVER WAND RF STERILE (DRAPES) ×2 IMPLANT
DERMABOND ADVANCED (GAUZE/BANDAGES/DRESSINGS)
DERMABOND ADVANCED .7 DNX12 (GAUZE/BANDAGES/DRESSINGS) IMPLANT
DRAPE LAPAROTOMY T 98X78 PEDS (DRAPES) ×2 IMPLANT
ELECT PENCIL ROCKER SW 15FT (MISCELLANEOUS) ×2 IMPLANT
ELECT REM PT RETURN 15FT ADLT (MISCELLANEOUS) ×2 IMPLANT
GAUZE 4X4 16PLY RFD (DISPOSABLE) ×2 IMPLANT
GLOVE SURG ORTHO 8.0 STRL STRW (GLOVE) ×2 IMPLANT
GOWN STRL REUS W/TWL XL LVL3 (GOWN DISPOSABLE) ×6 IMPLANT
HEMOSTAT SURGICEL 2X4 FIBR (HEMOSTASIS) IMPLANT
ILLUMINATOR WAVEGUIDE N/F (MISCELLANEOUS) IMPLANT
KIT BASIN OR (CUSTOM PROCEDURE TRAY) ×2 IMPLANT
NDL HYPO 25X1 1.5 SAFETY (NEEDLE) ×1 IMPLANT
NEEDLE HYPO 25X1 1.5 SAFETY (NEEDLE) ×2 IMPLANT
PACK BASIC VI WITH GOWN DISP (CUSTOM PROCEDURE TRAY) ×2 IMPLANT
SUT MNCRL AB 4-0 PS2 18 (SUTURE) ×2 IMPLANT
SUT VIC AB 3-0 SH 18 (SUTURE) ×2 IMPLANT
SYR BULB IRRIGATION 50ML (SYRINGE) ×2 IMPLANT
SYR CONTROL 10ML LL (SYRINGE) ×2 IMPLANT
TOWEL OR 17X26 10 PK STRL BLUE (TOWEL DISPOSABLE) ×2 IMPLANT
TOWEL OR NON WOVEN STRL DISP B (DISPOSABLE) ×2 IMPLANT

## 2018-12-05 NOTE — Transfer of Care (Signed)
Immediate Anesthesia Transfer of Care Note  Patient: Sherry Arroyo  Procedure(s) Performed: RIGHT INFERIOR PARATHYROIDECTOMY (Right Neck)  Patient Location: PACU  Anesthesia Type:General  Level of Consciousness: awake, oriented, drowsy and patient cooperative  Airway & Oxygen Therapy: Patient Spontanous Breathing and Patient connected to face mask oxygen  Post-op Assessment: Report given to RN, Post -op Vital signs reviewed and stable and Patient moving all extremities  Post vital signs: Reviewed and stable  Last Vitals:  Vitals Value Taken Time  BP 201/117 12/05/2018 11:21 AM  Temp    Pulse 75 12/05/2018 11:24 AM  Resp 12 12/05/2018 11:24 AM  SpO2 95 % 12/05/2018 11:24 AM  Vitals shown include unvalidated device data.  Last Pain:  Vitals:   12/05/18 1121  TempSrc:   PainSc: (P) 0-No pain         Complications: No apparent anesthesia complications

## 2018-12-05 NOTE — Anesthesia Procedure Notes (Signed)
Procedure Name: Intubation Date/Time: 12/05/2018 10:13 AM Performed by: Barnet Glasgow, MD Pre-anesthesia Checklist: Patient identified, Emergency Drugs available, Suction available, Patient being monitored and Timeout performed Patient Re-evaluated:Patient Re-evaluated prior to induction Oxygen Delivery Method: Circle system utilized Preoxygenation: Pre-oxygenation with 100% oxygen Induction Type: IV induction Ventilation: Mask ventilation without difficulty Laryngoscope Size: Mac and 4 Grade View: Grade I Tube type: Reinforced Tube size: 7.5 mm Number of attempts: 1 Airway Equipment and Method: Stylet Secured at: 21 cm Tube secured with: Tape Dental Injury: Teeth and Oropharynx as per pre-operative assessment

## 2018-12-05 NOTE — Interval H&P Note (Signed)
History and Physical Interval Note:  12/05/2018 9:46 AM  Sherry Arroyo  has presented today for surgery, with the diagnosis of PRIMARY HYPERPARATHYROIDISM RECURRENT.  The various methods of treatment have been discussed with the patient and family. After consideration of risks, benefits and other options for treatment, the patient has consented to    Procedure(s): RIGHT INFERIOR PARATHYROIDECTOMY (Right) as a surgical intervention .    The patient's history has been reviewed, patient examined, no change in status, stable for surgery.  I have reviewed the patient's chart and labs.  Questions were answered to the patient's satisfaction.    Armandina Gemma, [facility] Office: [place]

## 2018-12-05 NOTE — Anesthesia Postprocedure Evaluation (Signed)
Anesthesia Post Note  Patient: Sherry Arroyo  Procedure(s) Performed: RIGHT SUPERIOR PARATHYROIDECTOMY (Right Neck)     Patient location during evaluation: PACU Anesthesia Type: General Level of consciousness: awake and alert Pain management: pain level controlled Vital Signs Assessment: post-procedure vital signs reviewed and stable Respiratory status: spontaneous breathing, nonlabored ventilation, respiratory function stable and patient connected to nasal cannula oxygen Cardiovascular status: blood pressure returned to baseline and stable Postop Assessment: no apparent nausea or vomiting Anesthetic complications: no    Last Vitals:  Vitals:   12/05/18 1345 12/05/18 1350  BP: (!) 176/101   Pulse:    Resp:    Temp:    SpO2:  93%    Last Pain:  Vitals:   12/05/18 1346  TempSrc:   PainSc: 4                  Barnet Glasgow

## 2018-12-05 NOTE — Op Note (Signed)
OPERATIVE REPORT - PARATHYROIDECTOMY  Preoperative diagnosis: Primary hyperparathyroidism  Postop diagnosis: Same  Procedure: Right superior minimally invasive parathyroidectomy  Surgeon:  Armandina Gemma, MD  Anesthesia: General endotracheal  Estimated blood loss: Minimal  Preparation: ChloraPrep  Indications: Patient returns today to discuss results from diagnostic testing. Nuclear medicine parathyroid scan localizes a right inferior parathyroid adenoma. Likewise the ultrasound examination confirms a 1.5 cm hypoechoic mass consistent with parathyroid adenoma in the right inferior position. We reviewed these reports together today and the patient is provided with copies.  I recommended proceeding with minimally invasive right inferior parathyroidectomy.  Procedure: The patient was prepared in the pre-operative holding area. The patient was brought to the operating room and placed in a supine position on the operating room table. Following administration of general anesthesia, the patient was positioned and then prepped and draped in the usual strict aseptic fashion. After ascertaining that an adequate level of anesthesia been achieved, a neck incision was made with a #15 blade. Dissection was carried through subcutaneous tissues and platysma. Hemostasis was obtained with the electrocautery. Skin flaps were developed circumferentially and a Weitlander retractor was placed for exposure.  Strap muscles were incised in the midline. Strap muscles were reflected lateralley exposing the thyroid lobe. With gentle blunt dissection the thyroid lobe was mobilized.  Dissection was carried through adipose tissue and an enlarged parathyroid gland was identified. It was gently mobilized. Vascular structures were divided between small ligaclips. Care was taken to avoid the recurrent laryngeal nerve and the esophagus. The parathyroid gland was completely excised. It was submitted to pathology where frozen section  confirmed parathyroid tissue consistent with adenoma.  Neck was irrigated with warm saline and good hemostasis was noted. Fibrillar was placed in the operative field. Strap muscles were approximated in the midline with interrupted 3-0 Vicryl sutures. Platysma was closed with interrupted 3-0 Vicryl sutures. Marcaine was infiltrated circumferentially. Skin was closed with a running 4-0 Monocryl subcuticular suture. Wound was washed and dried and Dermabond was applied. Patient was awakened from anesthesia and brought to the recovery room. The patient tolerated the procedure well.   Armandina Gemma, MD [facility] Office: [phone]

## 2018-12-06 ENCOUNTER — Encounter (HOSPITAL_COMMUNITY): Payer: Self-pay | Admitting: Surgery

## 2019-03-21 ENCOUNTER — Encounter: Payer: Self-pay | Admitting: Internal Medicine

## 2019-03-21 ENCOUNTER — Ambulatory Visit (INDEPENDENT_AMBULATORY_CARE_PROVIDER_SITE_OTHER): Payer: Medicaid Other | Admitting: Internal Medicine

## 2019-03-21 DIAGNOSIS — E213 Hyperparathyroidism, unspecified: Secondary | ICD-10-CM

## 2019-03-21 DIAGNOSIS — E559 Vitamin D deficiency, unspecified: Secondary | ICD-10-CM | POA: Diagnosis not present

## 2019-03-21 DIAGNOSIS — M8580 Other specified disorders of bone density and structure, unspecified site: Secondary | ICD-10-CM

## 2019-03-21 NOTE — Patient Instructions (Signed)
Please continue 5000 units vitamin D daily.  Please come back for labs in 3 months for a follow-up appointment in 1 year.
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
## 2019-06-16 ENCOUNTER — Other Ambulatory Visit: Payer: Medicaid Other

## 2019-12-22 ENCOUNTER — Ambulatory Visit: Payer: Medicaid Other | Attending: Internal Medicine

## 2019-12-22 DIAGNOSIS — Z20822 Contact with and (suspected) exposure to covid-19: Secondary | ICD-10-CM

## 2019-12-23 LAB — NOVEL CORONAVIRUS, NAA: SARS-CoV-2, NAA: NOT DETECTED

## 2021-08-31 ENCOUNTER — Other Ambulatory Visit: Payer: Self-pay

## 2021-08-31 ENCOUNTER — Inpatient Hospital Stay (HOSPITAL_COMMUNITY)
Admission: EM | Admit: 2021-08-31 | Discharge: 2021-09-03 | DRG: 661 | Disposition: A | Discharge status: Home / Self Care | Payer: Medicare Other | Attending: Internal Medicine | Admitting: Internal Medicine

## 2021-08-31 ENCOUNTER — Encounter (HOSPITAL_COMMUNITY): Payer: Self-pay

## 2021-08-31 DIAGNOSIS — E6609 Other obesity due to excess calories: Secondary | ICD-10-CM | POA: Diagnosis not present

## 2021-08-31 DIAGNOSIS — E559 Vitamin D deficiency, unspecified: Secondary | ICD-10-CM | POA: Diagnosis present

## 2021-08-31 DIAGNOSIS — N200 Calculus of kidney: Secondary | ICD-10-CM

## 2021-08-31 DIAGNOSIS — I10 Essential (primary) hypertension: Secondary | ICD-10-CM | POA: Diagnosis present

## 2021-08-31 DIAGNOSIS — F17210 Nicotine dependence, cigarettes, uncomplicated: Secondary | ICD-10-CM | POA: Diagnosis present

## 2021-08-31 DIAGNOSIS — E210 Primary hyperparathyroidism: Secondary | ICD-10-CM | POA: Diagnosis not present

## 2021-08-31 DIAGNOSIS — E785 Hyperlipidemia, unspecified: Secondary | ICD-10-CM | POA: Diagnosis not present

## 2021-08-31 DIAGNOSIS — Z6834 Body mass index (BMI) 34.0-34.9, adult: Secondary | ICD-10-CM

## 2021-08-31 DIAGNOSIS — E213 Hyperparathyroidism, unspecified: Secondary | ICD-10-CM | POA: Diagnosis present

## 2021-08-31 DIAGNOSIS — Z90710 Acquired absence of both cervix and uterus: Secondary | ICD-10-CM | POA: Diagnosis not present

## 2021-08-31 DIAGNOSIS — N182 Chronic kidney disease, stage 2 (mild): Secondary | ICD-10-CM | POA: Diagnosis not present

## 2021-08-31 DIAGNOSIS — Z79899 Other long term (current) drug therapy: Secondary | ICD-10-CM

## 2021-08-31 DIAGNOSIS — N390 Urinary tract infection, site not specified: Secondary | ICD-10-CM

## 2021-08-31 DIAGNOSIS — E860 Dehydration: Secondary | ICD-10-CM | POA: Diagnosis present

## 2021-08-31 DIAGNOSIS — N179 Acute kidney failure, unspecified: Secondary | ICD-10-CM | POA: Diagnosis not present

## 2021-08-31 DIAGNOSIS — I129 Hypertensive chronic kidney disease with stage 1 through stage 4 chronic kidney disease, or unspecified chronic kidney disease: Secondary | ICD-10-CM | POA: Diagnosis present

## 2021-08-31 DIAGNOSIS — R1031 Right lower quadrant pain: Secondary | ICD-10-CM | POA: Diagnosis present

## 2021-08-31 DIAGNOSIS — N136 Pyonephrosis: Principal | ICD-10-CM | POA: Diagnosis present

## 2021-08-31 DIAGNOSIS — Z20822 Contact with and (suspected) exposure to covid-19: Secondary | ICD-10-CM | POA: Diagnosis present

## 2021-08-31 DIAGNOSIS — I708 Atherosclerosis of other arteries: Secondary | ICD-10-CM | POA: Diagnosis present

## 2021-08-31 LAB — URINALYSIS, ROUTINE W REFLEX MICROSCOPIC
Bilirubin Urine: NEGATIVE
Glucose, UA: NEGATIVE mg/dL
Ketones, ur: 5 mg/dL — AB
Nitrite: NEGATIVE
Protein, ur: >=300 mg/dL — AB
Specific Gravity, Urine: 1.024 (ref 1.005–1.030)
WBC, UA: >50 WBC/hpf — ABNORMAL HIGH (ref 0–5)
pH: 5 (ref 5.0–8.0)

## 2021-08-31 LAB — COMPREHENSIVE METABOLIC PANEL
ALT: 21 U/L (ref 0–44)
AST: 24 U/L (ref 15–41)
Albumin: 4.3 g/dL (ref 3.5–5.0)
Alkaline Phosphatase: 63 U/L (ref 38–126)
Anion gap: 11 (ref 5–15)
BUN: 14 mg/dL (ref 8–23)
CO2: 25 mmol/L (ref 22–32)
Calcium: 10.6 mg/dL — ABNORMAL HIGH (ref 8.9–10.3)
Chloride: 100 mmol/L (ref 98–111)
Creatinine, Ser: 1.62 mg/dL — ABNORMAL HIGH (ref 0.44–1.00)
GFR, Estimated: 35 mL/min — ABNORMAL LOW (ref >60)
Glucose, Bld: 101 mg/dL — ABNORMAL HIGH (ref 70–99)
Potassium: 3.9 mmol/L (ref 3.5–5.1)
Sodium: 136 mmol/L (ref 135–145)
Total Bilirubin: 1 mg/dL (ref 0.3–1.2)
Total Protein: 8.1 g/dL (ref 6.5–8.1)

## 2021-08-31 LAB — CBC WITH DIFFERENTIAL/PLATELET
Abs Immature Granulocytes: 0.06 10*3/uL (ref 0.00–0.07)
Basophils Absolute: 0.1 10*3/uL (ref 0.0–0.1)
Basophils Relative: 0 %
Eosinophils Absolute: 0.1 10*3/uL (ref 0.0–0.5)
Eosinophils Relative: 0 %
HCT: 44.5 % (ref 36.0–46.0)
Hemoglobin: 14.3 g/dL (ref 12.0–15.0)
Immature Granulocytes: 0 %
Lymphocytes Relative: 16 %
Lymphs Abs: 2.1 10*3/uL (ref 0.7–4.0)
MCH: 29.6 pg (ref 26.0–34.0)
MCHC: 32.1 g/dL (ref 30.0–36.0)
MCV: 92.1 fL (ref 80.0–100.0)
Monocytes Absolute: 1.2 10*3/uL — ABNORMAL HIGH (ref 0.1–1.0)
Monocytes Relative: 9 %
Neutro Abs: 10.1 10*3/uL — ABNORMAL HIGH (ref 1.7–7.7)
Neutrophils Relative %: 75 %
Platelets: 254 10*3/uL (ref 150–400)
RBC: 4.83 MIL/uL (ref 3.87–5.11)
RDW: 15.7 % — ABNORMAL HIGH (ref 11.5–15.5)
WBC: 13.6 10*3/uL — ABNORMAL HIGH (ref 4.0–10.5)
nRBC: 0 % (ref 0.0–0.2)

## 2021-08-31 LAB — LIPASE, BLOOD: Lipase: 27 U/L (ref 11–51)

## 2021-08-31 NOTE — ED Provider Notes (Signed)
Emergency Medicine Provider Triage Evaluation Note  Sherry Arroyo , a 65 y.o. female  was evaluated in triage.  Pt complains of gradual onset, constant, achy, right sided pain that began last night. Also complains of subjective fevers and chills. Hx of pancreatitis in the past with occasional drinking. No urinary symptoms. No previous abdominal surgeries besides hysterectomy.   Review of Systems  Positive: + abdominal pain, subjective fevers, chills Negative: - nausea, vomiting, urinary symptoms  Physical Exam  BP (!) 195/147 (BP Location: Left Arm)   Pulse 81   Temp 98.9 F (37.2 C) (Oral)   Resp 14   SpO2 92%  Gen:   Awake, no distress   Resp:  Normal effort  MSK:   Moves extremities without difficulty Other:  Mild right sided abdominal pain. No rebound or guarding.   Medical Decision Making  Medically screening exam initiated at 6:42 PM.  Appropriate orders placed.  Sherry Arroyo was informed that the remainder of the evaluation will be completed by another provider, this initial triage assessment does not replace that evaluation, and the importance of remaining in the ED until their evaluation is complete.     Sherry Maize, PA-C 08/31/21 1843    Sherry Ferguson, MD 08/31/21 2141

## 2021-08-31 NOTE — ED Triage Notes (Signed)
Pt here because of pain in RLQ. Pt stating that pain started last night. Pt states she gets hot and then she gets cold. Pt states the pain is new as of last night. Pt does not have any n/v or diarrhea, no black or tarry stools or no pain with urination at this time.

## 2021-09-01 ENCOUNTER — Inpatient Hospital Stay (HOSPITAL_COMMUNITY): Payer: Medicare Other | Admitting: Certified Registered"

## 2021-09-01 ENCOUNTER — Encounter (HOSPITAL_COMMUNITY)
Admission: EM | Disposition: A | Discharge status: Home / Self Care | Payer: Self-pay | Source: Home / Self Care | Attending: Internal Medicine

## 2021-09-01 ENCOUNTER — Encounter (HOSPITAL_COMMUNITY): Payer: Self-pay | Admitting: Internal Medicine

## 2021-09-01 ENCOUNTER — Emergency Department (HOSPITAL_COMMUNITY): Payer: Medicare Other

## 2021-09-01 ENCOUNTER — Inpatient Hospital Stay (HOSPITAL_COMMUNITY): Payer: Medicare Other

## 2021-09-01 DIAGNOSIS — Z6834 Body mass index (BMI) 34.0-34.9, adult: Secondary | ICD-10-CM

## 2021-09-01 DIAGNOSIS — I10 Essential (primary) hypertension: Secondary | ICD-10-CM | POA: Diagnosis present

## 2021-09-01 DIAGNOSIS — Z90710 Acquired absence of both cervix and uterus: Secondary | ICD-10-CM | POA: Diagnosis not present

## 2021-09-01 DIAGNOSIS — Z79899 Other long term (current) drug therapy: Secondary | ICD-10-CM | POA: Diagnosis not present

## 2021-09-01 DIAGNOSIS — N136 Pyonephrosis: Secondary | ICD-10-CM | POA: Diagnosis not present

## 2021-09-01 DIAGNOSIS — I708 Atherosclerosis of other arteries: Secondary | ICD-10-CM | POA: Diagnosis not present

## 2021-09-01 DIAGNOSIS — N200 Calculus of kidney: Secondary | ICD-10-CM

## 2021-09-01 DIAGNOSIS — Z20822 Contact with and (suspected) exposure to covid-19: Secondary | ICD-10-CM | POA: Diagnosis not present

## 2021-09-01 DIAGNOSIS — E559 Vitamin D deficiency, unspecified: Secondary | ICD-10-CM | POA: Diagnosis not present

## 2021-09-01 DIAGNOSIS — E210 Primary hyperparathyroidism: Secondary | ICD-10-CM | POA: Diagnosis not present

## 2021-09-01 DIAGNOSIS — I129 Hypertensive chronic kidney disease with stage 1 through stage 4 chronic kidney disease, or unspecified chronic kidney disease: Secondary | ICD-10-CM | POA: Diagnosis not present

## 2021-09-01 DIAGNOSIS — E860 Dehydration: Secondary | ICD-10-CM | POA: Diagnosis not present

## 2021-09-01 DIAGNOSIS — E785 Hyperlipidemia, unspecified: Secondary | ICD-10-CM | POA: Diagnosis present

## 2021-09-01 DIAGNOSIS — E6609 Other obesity due to excess calories: Secondary | ICD-10-CM

## 2021-09-01 DIAGNOSIS — N182 Chronic kidney disease, stage 2 (mild): Secondary | ICD-10-CM | POA: Diagnosis not present

## 2021-09-01 DIAGNOSIS — N179 Acute kidney failure, unspecified: Secondary | ICD-10-CM | POA: Diagnosis not present

## 2021-09-01 DIAGNOSIS — N390 Urinary tract infection, site not specified: Secondary | ICD-10-CM | POA: Diagnosis not present

## 2021-09-01 DIAGNOSIS — R1031 Right lower quadrant pain: Secondary | ICD-10-CM | POA: Diagnosis not present

## 2021-09-01 DIAGNOSIS — F17210 Nicotine dependence, cigarettes, uncomplicated: Secondary | ICD-10-CM | POA: Diagnosis not present

## 2021-09-01 HISTORY — DX: Body mass index (BMI) 34.0-34.9, adult: Z68.34

## 2021-09-01 HISTORY — DX: Other obesity due to excess calories: E66.09

## 2021-09-01 HISTORY — PX: CYSTOSCOPY W/ URETERAL STENT PLACEMENT: SHX1429

## 2021-09-01 LAB — RESP PANEL BY RT-PCR (FLU A&B, COVID) ARPGX2
Influenza A by PCR: NEGATIVE
Influenza B by PCR: NEGATIVE
SARS Coronavirus 2 by RT PCR: NEGATIVE

## 2021-09-01 LAB — HIV ANTIBODY (ROUTINE TESTING W REFLEX): HIV Screen 4th Generation wRfx: NONREACTIVE

## 2021-09-01 SURGERY — CYSTOSCOPY, WITH RETROGRADE PYELOGRAM AND URETERAL STENT INSERTION
Anesthesia: General | Laterality: Right

## 2021-09-01 MED ORDER — PRAVASTATIN SODIUM 40 MG PO TABS
40.0000 mg | ORAL_TABLET | Freq: Every day | ORAL | Status: DC
  Administered 2021-09-02 – 2021-09-03 (×2): 40 mg via ORAL
  Filled 2021-09-01 (×2): qty 1

## 2021-09-01 MED ORDER — HYDROCODONE-ACETAMINOPHEN 5-325 MG PO TABS: 1.0000 | ORAL_TABLET | ORAL | Status: DC | PRN

## 2021-09-01 MED ORDER — ALBUTEROL SULFATE HFA 108 (90 BASE) MCG/ACT IN AERS
INHALATION_SPRAY | RESPIRATORY_TRACT | Status: DC | PRN
  Administered 2021-09-01: 5 via RESPIRATORY_TRACT

## 2021-09-01 MED ORDER — LABETALOL HCL 5 MG/ML IV SOLN
INTRAVENOUS | Status: DC | PRN
  Administered 2021-09-01: 10 mg via INTRAVENOUS

## 2021-09-01 MED ORDER — IOHEXOL 300 MG/ML  SOLN
INTRAMUSCULAR | Status: DC | PRN
  Administered 2021-09-01: 4 mL via URETHRAL

## 2021-09-01 MED ORDER — DEXAMETHASONE SODIUM PHOSPHATE 10 MG/ML IJ SOLN
INTRAMUSCULAR | Status: AC
  Filled 2021-09-01: qty 1

## 2021-09-01 MED ORDER — CEFAZOLIN SODIUM-DEXTROSE 2-3 GM-%(50ML) IV SOLR
INTRAVENOUS | Status: DC | PRN
  Administered 2021-09-01: 2 g via INTRAVENOUS

## 2021-09-01 MED ORDER — IPRATROPIUM-ALBUTEROL 0.5-2.5 (3) MG/3ML IN SOLN
3.0000 mL | RESPIRATORY_TRACT | Status: AC
  Administered 2021-09-01: 3 mL via RESPIRATORY_TRACT
  Filled 2021-09-01: qty 3

## 2021-09-01 MED ORDER — ONDANSETRON HCL 4 MG/2ML IJ SOLN: 4.0000 mg | Freq: Once | INTRAMUSCULAR | Status: DC | PRN

## 2021-09-01 MED ORDER — LACTATED RINGERS IV BOLUS
1000.0000 mL | Freq: Once | INTRAVENOUS | Status: AC
  Administered 2021-09-01: 1000 mL via INTRAVENOUS

## 2021-09-01 MED ORDER — AMLODIPINE BESYLATE 10 MG PO TABS
10.0000 mg | ORAL_TABLET | Freq: Every day | ORAL | Status: DC
  Administered 2021-09-01 – 2021-09-03 (×3): 10 mg via ORAL
  Filled 2021-09-01: qty 2
  Filled 2021-09-01 (×2): qty 1

## 2021-09-01 MED ORDER — PROPOFOL 10 MG/ML IV BOLUS
INTRAVENOUS | Status: DC | PRN
  Administered 2021-09-01: 150 mg via INTRAVENOUS
  Administered 2021-09-01: 50 mg via INTRAVENOUS

## 2021-09-01 MED ORDER — BISACODYL 5 MG PO TBEC: 5.0000 mg | DELAYED_RELEASE_TABLET | Freq: Every day | ORAL | Status: DC | PRN

## 2021-09-01 MED ORDER — DEXAMETHASONE SODIUM PHOSPHATE 10 MG/ML IJ SOLN
INTRAMUSCULAR | Status: DC | PRN
  Administered 2021-09-01: 4 mg via INTRAVENOUS

## 2021-09-01 MED ORDER — HYDROMORPHONE HCL 1 MG/ML IJ SOLN: 0.2500 mg | INTRAMUSCULAR | Status: DC | PRN

## 2021-09-01 MED ORDER — ONDANSETRON HCL 4 MG/2ML IJ SOLN
INTRAMUSCULAR | Status: AC
  Filled 2021-09-01: qty 2

## 2021-09-01 MED ORDER — CEFAZOLIN SODIUM-DEXTROSE 2-4 GM/100ML-% IV SOLN
INTRAVENOUS | Status: AC
  Filled 2021-09-01: qty 100

## 2021-09-01 MED ORDER — LACTATED RINGERS IV SOLN: INTRAVENOUS | Status: AC

## 2021-09-01 MED ORDER — ONDANSETRON HCL 4 MG PO TABS: 4.0000 mg | ORAL_TABLET | Freq: Four times a day (QID) | ORAL | Status: DC | PRN

## 2021-09-01 MED ORDER — OXYCODONE HCL 5 MG/5ML PO SOLN
5.0000 mg | Freq: Once | ORAL | Status: DC | PRN
Start: 2021-09-01 — End: 2021-09-01

## 2021-09-01 MED ORDER — OXYCODONE HCL 5 MG PO TABS: 5.0000 mg | ORAL_TABLET | Freq: Once | ORAL | Status: DC | PRN

## 2021-09-01 MED ORDER — METOPROLOL TARTRATE 25 MG PO TABS
25.0000 mg | ORAL_TABLET | ORAL | Status: AC
  Administered 2021-09-02: 25 mg via ORAL
  Filled 2021-09-01: qty 1

## 2021-09-01 MED ORDER — LIDOCAINE HCL (PF) 2 % IJ SOLN
INTRAMUSCULAR | Status: AC
  Filled 2021-09-01: qty 5

## 2021-09-01 MED ORDER — ONDANSETRON HCL 4 MG/2ML IJ SOLN
4.0000 mg | Freq: Once | INTRAMUSCULAR | Status: AC
  Administered 2021-09-01: 4 mg via INTRAVENOUS
  Filled 2021-09-01: qty 2

## 2021-09-01 MED ORDER — SODIUM CHLORIDE 0.9 % IV SOLN
1.0000 g | INTRAVENOUS | Status: DC
  Administered 2021-09-02: 1 g via INTRAVENOUS
  Filled 2021-09-01 (×2): qty 10

## 2021-09-01 MED ORDER — ACETAMINOPHEN 650 MG RE SUPP: 650.0000 mg | Freq: Four times a day (QID) | RECTAL | Status: DC | PRN

## 2021-09-01 MED ORDER — DOCUSATE SODIUM 100 MG PO CAPS
100.0000 mg | ORAL_CAPSULE | Freq: Two times a day (BID) | ORAL | Status: DC
  Administered 2021-09-01 – 2021-09-03 (×4): 100 mg via ORAL
  Filled 2021-09-01 (×4): qty 1

## 2021-09-01 MED ORDER — ACETAMINOPHEN 325 MG PO TABS: 650.0000 mg | ORAL_TABLET | Freq: Four times a day (QID) | ORAL | Status: DC | PRN

## 2021-09-01 MED ORDER — LACTATED RINGERS IV SOLN: INTRAVENOUS | Status: DC

## 2021-09-01 MED ORDER — MORPHINE SULFATE (PF) 4 MG/ML IV SOLN
4.0000 mg | Freq: Once | INTRAVENOUS | Status: AC
  Administered 2021-09-01: 4 mg via INTRAVENOUS
  Filled 2021-09-01: qty 1

## 2021-09-01 MED ORDER — POLYETHYLENE GLYCOL 3350 17 G PO PACK: 17.0000 g | PACK | Freq: Every day | ORAL | Status: DC | PRN

## 2021-09-01 MED ORDER — LIDOCAINE 2% (20 MG/ML) 5 ML SYRINGE
INTRAMUSCULAR | Status: DC | PRN
  Administered 2021-09-01: 60 mg via INTRAVENOUS
  Administered 2021-09-01: 40 mg via INTRAVENOUS

## 2021-09-01 MED ORDER — METOPROLOL TARTRATE 25 MG PO TABS
25.0000 mg | ORAL_TABLET | Freq: Two times a day (BID) | ORAL | Status: DC
  Administered 2021-09-01 – 2021-09-03 (×5): 25 mg via ORAL
  Filled 2021-09-01 (×5): qty 1

## 2021-09-01 MED ORDER — SODIUM CHLORIDE 0.9 % IV SOLN
1.0000 g | Freq: Once | INTRAVENOUS | Status: AC
  Administered 2021-09-01: 1 g via INTRAVENOUS
  Filled 2021-09-01: qty 10

## 2021-09-01 MED ORDER — LOSARTAN POTASSIUM 50 MG PO TABS
100.0000 mg | ORAL_TABLET | Freq: Every day | ORAL | Status: DC
  Filled 2021-09-01: qty 2

## 2021-09-01 MED ORDER — ONDANSETRON HCL 4 MG/2ML IJ SOLN
4.0000 mg | Freq: Four times a day (QID) | INTRAMUSCULAR | Status: DC | PRN
  Administered 2021-09-01: 4 mg via INTRAVENOUS

## 2021-09-01 MED ORDER — MORPHINE SULFATE (PF) 2 MG/ML IV SOLN: 2.0000 mg | INTRAVENOUS | Status: DC | PRN

## 2021-09-01 MED ORDER — LABETALOL HCL 5 MG/ML IV SOLN
INTRAVENOUS | Status: AC
  Filled 2021-09-01: qty 4

## 2021-09-01 MED ORDER — HYDRALAZINE HCL 20 MG/ML IJ SOLN: 5.0000 mg | INTRAMUSCULAR | Status: DC | PRN

## 2021-09-01 MED ORDER — PROPOFOL 10 MG/ML IV BOLUS
INTRAVENOUS | Status: AC
  Filled 2021-09-01: qty 20

## 2021-09-01 SURGICAL SUPPLY — 14 items
BAG URO CATCHER STRL LF (MISCELLANEOUS) ×2 IMPLANT
CATH URET 5FR 28IN OPEN ENDED (CATHETERS) ×1 IMPLANT
CLOTH BEACON ORANGE TIMEOUT ST (SAFETY) ×2 IMPLANT
GLOVE SURG ENC TEXT LTX SZ7 (GLOVE) ×4 IMPLANT
GOWN STRL REUS W/TWL LRG LVL3 (GOWN DISPOSABLE) ×3 IMPLANT
GUIDEWIRE STR DUAL SENSOR (WIRE) ×2 IMPLANT
GUIDEWIRE ZIPWRE .038 STRAIGHT (WIRE) IMPLANT
KIT TURNOVER KIT A (KITS) ×2 IMPLANT
MANIFOLD NEPTUNE II (INSTRUMENTS) ×2 IMPLANT
PACK CYSTO (CUSTOM PROCEDURE TRAY) ×2 IMPLANT
STENT URET 6FRX26 CONTOUR (STENTS) ×1 IMPLANT
SYR 10ML LL (SYRINGE) ×2 IMPLANT
TUBING CONNECTING 10 (TUBING) ×2 IMPLANT
TUBING UROLOGY SET (TUBING) IMPLANT

## 2021-09-01 NOTE — Anesthesia Postprocedure Evaluation (Signed)
Anesthesia Post Note  Patient: Sherry Arroyo  Procedure(s) Performed: CYSTOSCOPY WITH RETROGRADE PYELOGRAM/URETERAL STENT PLACEMENT (Right)     Patient location during evaluation: PACU Anesthesia Type: General Level of consciousness: awake and alert and oriented Pain management: pain level controlled Vital Signs Assessment: post-procedure vital signs reviewed and stable Respiratory status: spontaneous breathing, nonlabored ventilation and respiratory function stable Cardiovascular status: blood pressure returned to baseline and stable Postop Assessment: no apparent nausea or vomiting Anesthetic complications: no   No notable events documented.  Last Vitals:  Vitals:   09/01/21 1830 09/01/21 1845  BP: (!) 177/98 (!) 180/106  Pulse: (!) 56 (!) 58  Resp: 15 19  Temp:  36.6 C  SpO2: 94% 95%    Last Pain:  Vitals:   09/01/21 1845  TempSrc:   PainSc: 0-No pain                 Clevland Cork A.

## 2021-09-01 NOTE — Progress Notes (Signed)
1 Day Post-Op Subjective: Pain controlled. No nausea or emesis.  Objective: Vital signs in last 24 hours: Temp:  [97.5 F (36.4 C)-98.7 F (37.1 C)] 98.5 F (36.9 C) (09/30 0444) Pulse Rate:  [56-83] 63 (09/30 0444) Resp:  [15-22] 18 (09/30 0444) BP: (125-183)/(77-137) 163/93 (09/30 0444) SpO2:  [89 %-100 %] 92 % (09/30 0444) Weight:  [95.3 kg] 95.3 kg (09/29 1511)  Intake/Output from previous day: 09/29 0701 - 09/30 0700 In: 1750 [I.V.:700; IV Piggyback:1050] Out: -  Intake/Output this shift: No intake/output data recorded.  Physical Exam:  General: Alert and oriented CV: RRR Lungs: Clear Abdomen: Soft, ND, ATTP Ext: NT, No erythema  Lab Results: Recent Labs    08/31/21 2000 09/02/21 0508  HGB 14.3 12.9  HCT 44.5 41.0   BMET Recent Labs    08/31/21 2000 09/02/21 0508  NA 136 136  K 3.9 4.2  CL 100 103  CO2 25 26  GLUCOSE 101* 134*  BUN 14 21  CREATININE 1.62* 1.39*  CALCIUM 10.6* 9.9     Studies/Results: CT ABDOMEN PELVIS WO CONTRAST  Result Date: 09/01/2021 CLINICAL DATA:  Right lower quadrant pain, flank pain EXAM: CT ABDOMEN AND PELVIS WITHOUT CONTRAST TECHNIQUE: Multidetector CT imaging of the abdomen and pelvis was performed following the standard protocol without IV contrast. COMPARISON:  None. FINDINGS: Lower chest: No acute abnormality. Hepatobiliary: No focal hepatic abnormality. Gallbladder unremarkable. Pancreas: No focal abnormality or ductal dilatation. Spleen: No focal abnormality.  Normal size. Adrenals/Urinary Tract: Moderate right hydronephrosis and perinephric stranding due to 5 mm proximal right ureteral stone. Small nonobstructing stone in the lower pole of the right kidney. No stones or hydronephrosis on the left. Urinary bladder and adrenal glands unremarkable. Stomach/Bowel: Stomach, large and small bowel grossly unremarkable. Normal appendix. Vascular/Lymphatic: Calcified aorta and iliac vessels. No evidence of aneurysm or adenopathy.  Reproductive: Prior hysterectomy.  No adnexal masses. Other: No free fluid or free air. Musculoskeletal: No acute bony abnormality. IMPRESSION: 5 mm proximal right ureteral stone with right hydronephrosis and perinephric stranding. Right lower pole nephrolithiasis. Aortoiliac atherosclerosis. Electronically Signed   By: Rolm Baptise M.D.   On: 09/01/2021 09:34   DG C-Arm 1-60 Min-No Report  Result Date: 09/01/2021 Fluoroscopy was utilized by the requesting physician.  No radiographic interpretation.    Assessment/Plan: Obstructing right ureteral stone: CT A/P 09/02/2019 with 5 mm proximal right ureteral stone with right hydronephrosis and perinephric stranding.  Afebrile.  WBC 13.6.  Urinalysis with negative nitrite.  Urine culture pending.  Creatinine 1.62 from baseline of 0.84. S/p R ureteral stent 09/01/2021. AKI UTI: Ucx pending  F/u urine culture to ensure sensitive. Ok to discharge home on culture specific antibiotics. Will arrange for followup to treat stone outpatient. Following peripherally. Please call with questions.   LOS: 1 day   Matt R. Manasseh Pittsley MD 09/02/2021, 11:29 AM [facility]  Pager: [phone]

## 2021-09-01 NOTE — Op Note (Signed)
Operative Note   Preoperative diagnosis:  1.  Right obstructing ureteral stone 2. UTI 3. AKI   Postoperative diagnosis: 1.  Right obstructing ureteral stone 2. UTI 3. AKI   Procedure(s): 1.  Cystoscopy 2. Right retrograde pyelogram with interpretation 3. Right ureteral stent placement 4. Fluoroscopy <1 hour with intraoperative interpretation   Surgeon: Rexene Alberts, MD   Assistants:  None   Anesthesia:  General   Complications:  None   EBL:  Minimal   Specimens: 1. Right renal pelvis urine culture   Drains/Catheters: 1.  Right 6Fr x 26cm ureteral stent   Intraoperative findings:   Cystoscopy demonstrated no suspicious lesions, masses, stones or other pathology. Right retrograde pyelogram demonstrated moderate right hydronephrosis. Successful right ureteral stent placement with curl in the renal pelvis and bladder respectively.   Indication:  Sherry Arroyo is a 65 y.o. female with CT A/P 09/01/2021 demonstrating 61mm proximal right ureteral stone with sign of UTI. After reviewing the management options for treatment, she elected to proceed with the above surgical procedure(s). We have discussed the potential benefits and risks of the procedure, side effects of the proposed treatment, the likelihood of the patient achieving the goals of the procedure, and any potential problems that might occur during the procedure or recuperation. Informed consent has been obtained.   Description of procedure: The patient was taken to the operating room and general anesthesia was induced.  The patient was placed in the dorsal lithotomy position, prepped and draped in the usual sterile fashion, and preoperative antibiotics were administered. A preoperative time-out was performed.    Cystourethroscopy was performed.  The patient's urethra was examined and was normal. The bladder was then systematically examined in its entirety. There was no evidence for any bladder tumors, stones, or other  mucosal pathology.     Attention then turned to the right ureteral orifice. A 0.038 zip wire was passed through the right orifice and over the wire a 5 Fr open ended catheter was inserted and passed up to the level of the renal pelvis. There was a hydronephrotic drip and this was sent for a right renal pelvis urine for culture. Omnipaque contrast was injected through the ureteral catheter and a retrograde pyelogram was performed with findings as dictated above. The wire was then replaced and the open ended catheter was removed.    A 6Fr x 26cm ureteral stent was advance over the wire. The stent was positioned appropriately under fluoroscopic and cystoscopic guidance.  The wire was then removed with an adequate stent curl noted in the right upper pole as well as in the bladder.   The bladder was then emptied and the procedure ended.  The patient appeared to tolerate the procedure well and without complications.  The patient was able to be awakened and transferred to the recovery unit in satisfactory condition.    Plan:  F/u urine culture to ensure sensitive. Ok to discharge home on culture specific antibiotics. Will arrange for followup to treat stone outpatient.   [facility]  Pager: [phone]

## 2021-09-01 NOTE — Anesthesia Procedure Notes (Addendum)
Procedure Name: LMA Insertion Date/Time: 09/01/2021 5:50 PM Performed by: Milford Cage, CRNA Pre-anesthesia Checklist: Patient identified, Emergency Drugs available, Suction available and Patient being monitored Patient Re-evaluated:Patient Re-evaluated prior to induction Oxygen Delivery Method: Circle system utilized Preoxygenation: Pre-oxygenation with 100% oxygen Induction Type: IV induction LMA: LMA with gastric port inserted LMA Size: 4.0 Number of attempts: 2 Tube secured with: Tape Dental Injury: Teeth and Oropharynx as per pre-operative assessment

## 2021-09-01 NOTE — ED Provider Notes (Signed)
[facility] EMERGENCY DEPARTMENT Provider Note   CSN: [id number] Arrival date & time: 08/31/21  1707     History Chief Complaint  Patient presents with   Abdominal Pain    Sherry Arroyo is a 65 y.o. female.   Abdominal Pain Associated symptoms: chills, fatigue and fever   Associated symptoms: no chest pain, no constipation, no cough, no diarrhea, no dysuria, no hematuria, no nausea, no shortness of breath, no sore throat and no vomiting   Patient presents for abdominal pain, fevers, and chills for the past 2 days.  She describes the abdominal pain as intermittent and alternating between the left and the right.  Initially it was in the right lower quadrant.  Currently, it is more prominent on the left.  It has been located more on the peripheral flanks.  She denies any recent urinary symptoms.  She has not had nausea, vomiting, or diarrhea.  Medical history is notable for hyperparathyroidism s/p parathyroidectomy.  She denies any history of similar abdominal pain.    Past Medical History:  Diagnosis Date   Chronic kidney disease (CKD), stage II (mild)    pt denies at preop   Class 1 obesity due to excess calories with body mass index (BMI) of 34.0 to 34.9 in adult 09/01/2021   Dyspnea    Hypercalcemia    Hypertension    Primary hyperparathyroidism [facility])    endocrinologist-- dr Cruzita Lederer   Vitamin D deficiency     Patient Active Problem List   Diagnosis Date Noted   Complicated UTI (urinary tract infection) 09/01/2021   Nephrolithiasis 09/01/2021   Essential hypertension 09/01/2021   Dyslipidemia 09/01/2021   Class 1 obesity due to excess calories with body mass index (BMI) of 34.0 to 34.9 in adult 09/01/2021   Osteopenia 03/21/2019   Vitamin D deficiency 09/18/2018   Hyperparathyroidism ([place]) 05/20/2018    Past Surgical History:  Procedure Laterality Date   LAPAROSCOPIC ASSISTED VAGINAL HYSTERECTOMY  09-25-2002    dr dillard  @[facility]    W/  BILATERAL  SALPINGO-OOPHORECTOMY   MINIMALLY INVASIVE RADIOACTIVE PARATHYROIDECTOMY  11-03-2005     dr Debbora Presto  @[facility]    PARATHYROIDECTOMY Right 12/05/2018   Procedure: RIGHT SUPERIOR PARATHYROIDECTOMY;  Surgeon: Armandina Gemma, MD;  Location: [facility] ORS;  Service: General;  Laterality: Right;   right inferior parathyroidectomy     Dr. Harlow Asa 12-05-2018     OB History   No obstetric history on file.     History reviewed. No pertinent family history.  Social History   Tobacco Use   Smoking status: Every Day    Packs/day: 0.50    Years: 50.00    Pack years: 25.00    Types: Cigarettes   Smokeless tobacco: Never  Vaping Use   Vaping Use: Never used  Substance Use Topics   Alcohol use: Yes    Alcohol/week: 1.0 standard drink    Types: 1 Cans of beer per week    Comment: "too much", will drink as much as she can get - 2 40s and a bootlegger of wine   Drug use: Never    Home Medications Prior to Admission medications   Medication Sig Start Date End Date Taking? Authorizing Provider  amLODipine (NORVASC) 10 MG tablet Take 10 mg by mouth daily.  08/18/16   [provider]  losartan-hydrochlorothiazide (HYZAAR) 100-25 MG tablet Take 1 tablet by mouth daily. 03/09/21   [provider]  metoprolol succinate (TOPROL-XL) 100 MG 24 hr tablet Take 100 mg  by mouth daily. 04/20/21   [provider]  pravastatin (PRAVACHOL) 40 MG tablet Take 40 mg by mouth daily.  08/18/16   [provider]  spironolactone (ALDACTONE) 25 MG tablet Take 25 mg by mouth daily. 03/09/21   [provider]    Allergies    Patient has no known allergies.  Review of Systems   Review of Systems  Constitutional:  Positive for appetite change, chills, fatigue and fever.  HENT:  Negative for congestion, ear pain, sore throat and trouble swallowing.   Eyes:  Negative for pain and visual disturbance.  Respiratory:  Negative for cough, chest tightness, shortness of breath and wheezing.    Cardiovascular:  Negative for chest pain, palpitations and leg swelling.  Gastrointestinal:  Positive for abdominal pain. Negative for abdominal distention, blood in stool, constipation, diarrhea, nausea and vomiting.  Genitourinary:  Positive for flank pain. Negative for dysuria, hematuria, pelvic pain and urgency.  Musculoskeletal:  Negative for arthralgias, back pain, joint swelling, myalgias and neck pain.  Skin:  Negative for color change and rash.  Allergic/Immunologic: Negative for immunocompromised state.  Neurological:  Negative for dizziness, seizures, syncope, weakness, light-headedness, numbness and headaches.  All other systems reviewed and are negative.  Physical Exam Updated Vital Signs BP (!) 173/99   Pulse 65   Temp 98.1 F (36.7 C) (Oral)   Resp (!) 22   Ht 5\' 5"  (1.651 m)   Wt 95.3 kg   SpO2 99%   BMI 34.96 kg/m   Physical Exam Vitals and nursing note reviewed.  Constitutional:      General: She is not in acute distress.    Appearance: She is well-developed. She is not ill-appearing, toxic-appearing or diaphoretic.  HENT:     Head: Normocephalic and atraumatic.     Mouth/Throat:     Mouth: Mucous membranes are moist.     Pharynx: Oropharynx is clear.  Eyes:     Conjunctiva/sclera: Conjunctivae normal.  Cardiovascular:     Rate and Rhythm: Normal rate and regular rhythm.     Heart sounds: No murmur heard. Pulmonary:     Effort: Pulmonary effort is normal. No respiratory distress.     Breath sounds: Normal breath sounds. No wheezing or rales.  Chest:     Chest wall: No tenderness.  Abdominal:     Palpations: Abdomen is soft.     Tenderness: There is abdominal tenderness in the right lower quadrant. There is no right CVA tenderness, left CVA tenderness, guarding or rebound.  Musculoskeletal:     Cervical back: Neck supple.  Skin:    General: Skin is warm and dry.  Neurological:     General: No focal deficit present.     Mental Status: She is alert  and oriented to person, place, and time.     Cranial Nerves: No cranial nerve deficit.     Motor: No weakness.  Psychiatric:        Mood and Affect: Mood normal.        Behavior: Behavior normal.    ED Results / Procedures / Treatments   Labs (all labs ordered are listed, but only abnormal results are displayed) Labs Reviewed  COMPREHENSIVE METABOLIC PANEL - Abnormal; Notable for the following components:      Result Value   Glucose, Bld 101 (*)    Creatinine, Ser 1.62 (*)    Calcium 10.6 (*)    GFR, Estimated 35 (*)    All other components within normal limits  CBC WITH DIFFERENTIAL/PLATELET - Abnormal; Notable for the following components:   WBC 13.6 (*)    RDW 15.7 (*)    Neutro Abs 10.1 (*)    Monocytes Absolute 1.2 (*)    All other components within normal limits  URINALYSIS, ROUTINE W REFLEX MICROSCOPIC - Abnormal; Notable for the following components:   APPearance CLOUDY (*)    Hgb urine dipstick MODERATE (*)    Ketones, ur 5 (*)    Protein, ur >=300 (*)    Leukocytes,Ua SMALL (*)    WBC, UA >50 (*)    Bacteria, UA RARE (*)    All other components within normal limits  RESP PANEL BY RT-PCR (FLU A&B, COVID) ARPGX2  URINE CULTURE  LIPASE, BLOOD  HIV ANTIBODY (ROUTINE TESTING W REFLEX)  BASIC METABOLIC PANEL  CBC    EKG None  Radiology CT ABDOMEN PELVIS WO CONTRAST  Result Date: 09/01/2021 CLINICAL DATA:  Right lower quadrant pain, flank pain EXAM: CT ABDOMEN AND PELVIS WITHOUT CONTRAST TECHNIQUE: Multidetector CT imaging of the abdomen and pelvis was performed following the standard protocol without IV contrast. COMPARISON:  None. FINDINGS: Lower chest: No acute abnormality. Hepatobiliary: No focal hepatic abnormality. Gallbladder unremarkable. Pancreas: No focal abnormality or ductal dilatation. Spleen: No focal abnormality.  Normal size. Adrenals/Urinary Tract: Moderate right hydronephrosis and perinephric stranding due to 5 mm proximal right ureteral stone.  Small nonobstructing stone in the lower pole of the right kidney. No stones or hydronephrosis on the left. Urinary bladder and adrenal glands unremarkable. Stomach/Bowel: Stomach, large and small bowel grossly unremarkable. Normal appendix. Vascular/Lymphatic: Calcified aorta and iliac vessels. No evidence of aneurysm or adenopathy. Reproductive: Prior hysterectomy.  No adnexal masses. Other: No free fluid or free air. Musculoskeletal: No acute bony abnormality. IMPRESSION: 5 mm proximal right ureteral stone with right hydronephrosis and perinephric stranding. Right lower pole nephrolithiasis. Aortoiliac atherosclerosis. Electronically Signed   By: Rolm Baptise M.D.   On: 09/01/2021 09:34    Procedures Procedures   Medications Ordered in ED Medications  amLODipine (NORVASC) tablet 10 mg ( Oral Automatically Held 09/09/21 1000)  metoprolol tartrate (LOPRESSOR) tablet 25 mg ( Oral Automatically Held 09/09/21 2200)  lactated ringers infusion ( Intravenous New Bag/Given 09/01/21 1738)  pravastatin (PRAVACHOL) tablet 40 mg ( Oral Automatically Held 09/09/21 1000)  lactated ringers infusion ( Intravenous New Bag/Given 09/01/21 1534)  acetaminophen (TYLENOL) tablet 650 mg ( Oral MAR Hold 09/01/21 1517)    Or  acetaminophen (TYLENOL) suppository 650 mg ( Rectal MAR Hold 09/01/21 1517)  HYDROcodone-acetaminophen (NORCO/VICODIN) 5-325 MG per tablet 1-2 tablet ( Oral MAR Hold 09/01/21 1517)  morphine 2 MG/ML injection 2 mg ( Intravenous MAR Hold 09/01/21 1517)  docusate sodium (COLACE) capsule 100 mg ( Oral Automatically Held 09/09/21 2200)  polyethylene glycol (MIRALAX / GLYCOLAX) packet 17 g ( Oral MAR Hold 09/01/21 1517)  bisacodyl (DULCOLAX) EC tablet 5 mg ( Oral MAR Hold 09/01/21 1517)  ondansetron (ZOFRAN) tablet 4 mg ( Oral See Alternative 09/01/21 1756)    Or  ondansetron (ZOFRAN) injection 4 mg (4 mg Intravenous Given 09/01/21 1756)  hydrALAZINE (APRESOLINE) injection 5 mg ( Intravenous MAR Hold 09/01/21 1517)   cefTRIAXone (ROCEPHIN) 1 g in sodium chloride 0.9 % 100 mL IVPB ( Intravenous Automatically Held 09/10/21 0700)  metoprolol tartrate (LOPRESSOR) tablet 25 mg (has no administration in time range)  ceFAZolin (ANCEF) 2-4 GM/100ML-% IVPB (has no administration in time range)  lactated ringers bolus 1,000 mL (0 mLs Intravenous Stopped 09/01/21 1215)  morphine 4 MG/ML injection 4 mg (4 mg Intravenous Given 09/01/21 0840)  ondansetron (ZOFRAN) injection 4 mg (4 mg Intravenous Given 09/01/21 0841)  cefTRIAXone (ROCEPHIN) 1 g in sodium chloride 0.9 % 100 mL IVPB (0 g Intravenous Stopped 09/01/21 0948)  ipratropium-albuterol (DUONEB) 0.5-2.5 (3) MG/3ML nebulizer solution 3 mL (3 mLs Nebulization Given 09/01/21 1550)    ED Course  I have reviewed the triage vital signs and the nursing notes.  Pertinent labs & imaging results that were available during my care of the patient were reviewed by me and considered in my medical decision making (see chart for details).    MDM Rules/Calculators/A&P                           Patient presents for 2 days of abdominal pain, fevers, and chills.  Prior to being bedded in the ED, laboratory work-up was initiated.  Results were notable for AKI, leukocytosis, and evidence of urinary infection.  On initial assessment, vital signs notable for hypertension.  Patient does endorse ongoing abdominal pain that was previously on the right and now more prominent on the left.  She also endorses current nausea.  On exam, she does have tenderness in the right lower quadrant and right flank.  IV fluids, analgesia, antiemetic medications were ordered.  Ceftriaxone was ordered for empiric treatment of urinary infection.  Patient has AKI without elevation in BUN.  Noncontrasted CT scan was ordered to assess for obstructive uropathy.  Scan showed a 5 mm proximal ureteral stone with upstream hydronephrosis and perinephric stranding.  Order was placed for urology consult.  Patient was admitted  to hospitalist for further management.  Final Clinical Impression(s) / ED Diagnoses Final diagnoses:  Nephrolithiasis    Rx / DC Orders ED Discharge Orders     None        Godfrey Pick, MD 09/01/21 [phone]

## 2021-09-01 NOTE — Transfer of Care (Signed)
Immediate Anesthesia Transfer of Care Note  Patient: Sherry Arroyo  Procedure(s) Performed: CYSTOSCOPY WITH RETROGRADE PYELOGRAM/URETERAL STENT PLACEMENT (Right)  Patient Location: PACU  Anesthesia Type:General  Level of Consciousness: awake  Airway & Oxygen Therapy: Patient Spontanous Breathing and Patient connected to face mask oxygen  Post-op Assessment: Report given to RN and Post -op Vital signs reviewed and unstable, Anesthesiologist notified  Post vital signs: Reviewed and unstable  Last Vitals:  Vitals Value Taken Time  BP 183/137 09/01/21 1818  Temp    Pulse 71 09/01/21 1822  Resp 19 09/01/21 1822  SpO2 94 % 09/01/21 1822  Vitals shown include unvalidated device data.  Last Pain:  Vitals:   09/01/21 1600  TempSrc:   PainSc: 8       Patients Stated Pain Goal: 3 (15/37/94 3276)  Complications: No notable events documented.

## 2021-09-01 NOTE — Anesthesia Preprocedure Evaluation (Addendum)
Anesthesia Evaluation  Patient identified by MRN, date of birth, ID band Patient awake    Reviewed: Allergy & Precautions, NPO status , Patient's Chart, lab work & pertinent test results, reviewed documented beta blocker date and time   Airway Mallampati: II  TM Distance: >3 FB Neck ROM: Full    Dental  (+) Partial Upper, Missing, Dental Advisory Given,    Pulmonary shortness of breath and with exertion, Current SmokerPatient did not abstain from smoking.,    Pulmonary exam normal breath sounds clear to auscultation       Cardiovascular hypertension, Pt. on home beta blockers and Pt. on medications Normal cardiovascular exam Rhythm:Regular Rate:Normal  EKG 09/01/21 NSR, biatrial enlargement, LVH, anterior MI   Neuro/Psych negative neurological ROS  negative psych ROS   GI/Hepatic negative GI ROS, Neg liver ROS,   Endo/Other  Obesity Hx/o primary hyperparathyroidism S/P parathyroidectomy  Renal/GU Renal InsufficiencyRenal diseaseRight ureteral calculus  negative genitourinary   Musculoskeletal negative musculoskeletal ROS (+)   Abdominal (+) + obese,   Peds  Hematology negative hematology ROS (+)   Anesthesia Other Findings   Reproductive/Obstetrics                            Anesthesia Physical Anesthesia Plan  ASA: 3  Anesthesia Plan: General   Post-op Pain Management:    Induction: Intravenous  PONV Risk Score and Plan: 4 or greater and Treatment may vary due to age or medical condition, Ondansetron and Dexamethasone  Airway Management Planned: LMA  Additional Equipment:   Intra-op Plan:   Post-operative Plan: Extubation in OR  Informed Consent: I have reviewed the patients History and Physical, chart, labs and discussed the procedure including the risks, benefits and alternatives for the proposed anesthesia with the patient or authorized representative who has indicated  his/her understanding and acceptance.     Dental advisory given  Plan Discussed with: CRNA and Anesthesiologist  Anesthesia Plan Comments:         Anesthesia Quick Evaluation

## 2021-09-01 NOTE — ED Notes (Signed)
Report called to Jarrett Soho, RN at [facility]. [facility] at bedside to transport to [facility]. All papers, pt belongings given to [facility].

## 2021-09-01 NOTE — H&P (Signed)
History and Physical    Sherry Arroyo UVO:[id number] DOB: 04-05-1956 DOA: 08/31/2021  PCP: Charolette Forward, MD Consultants:  Cruzita Lederer - endocrinology Patient coming from: Lives with brother and niece; NOK: [place] Man[place], [phone]   Chief Complaint: RLQ pain  HPI: Sherry Arroyo is a 65 y.o. female with medical history significant of HTN and primary hyperparathyroidism presenting with RLQ pain.  Her sides have been hurting.  She couldn't sleep or even lie down, just walked the floors all night.  Both sides are hurting.  No fever.  No urinary symptoms at all.  No h/o kidney stones.  No hematuria.  Medications in the ER have helped a little.    ED Course: UTI with 5 mm stone.  Consulted urology, waiting to hear back.  +upstream hydronephrosis.  Likely needs to go to [place].  Review of Systems: As per HPI; otherwise review of systems reviewed and negative.   Ambulatory Status:  Ambulates without assistance  COVID Vaccine Status:  First shot    Past Medical History:  Diagnosis Date   Chronic kidney disease (CKD), stage II (mild)    pt denies at preop   Class 1 obesity due to excess calories with body mass index (BMI) of 34.0 to 34.9 in adult 09/01/2021   Dyspnea    Hypercalcemia    Hypertension    Primary hyperparathyroidism [facility])    endocrinologist-- dr Cruzita Lederer   Vitamin D deficiency     Past Surgical History:  Procedure Laterality Date   LAPAROSCOPIC ASSISTED VAGINAL HYSTERECTOMY  09-25-2002    dr dillard  @[facility]    W/  BILATERAL SALPINGO-OOPHORECTOMY   MINIMALLY INVASIVE RADIOACTIVE PARATHYROIDECTOMY  11-03-2005     dr Debbora Presto  @[facility]    PARATHYROIDECTOMY Right 12/05/2018   Procedure: RIGHT SUPERIOR PARATHYROIDECTOMY;  Surgeon: Armandina Gemma, MD;  Location: [facility];  Service: General;  Laterality: Right;   right inferior parathyroidectomy     Dr. Harlow Asa 12-05-2018    Social History   Socioeconomic History   Marital status: Single    Spouse name: Not on file   Number of  children: Not on file   Years of education: Not on file   Highest education level: Not on file  Occupational History   Occupation: retired  Tobacco Use   Smoking status: Every Day    Packs/day: 0.50    Years: 50.00    Pack years: 25.00    Types: Cigarettes   Smokeless tobacco: Never  Vaping Use   Vaping Use: Never used  Substance and Sexual Activity   Alcohol use: Yes    Alcohol/week: 1.0 standard drink    Types: 1 Cans of beer per week    Comment: "too much", will drink as much as she can get - 2 40s and a bootlegger of wine   Drug use: Never   Sexual activity: Not Currently  Other Topics Concern   Not on file  Social History Narrative   Not on file   Social Determinants of Health   Financial Resource Strain: Not on file  Food Insecurity: Not on file  Transportation Needs: Not on file  Physical Activity: Not on file  Stress: Not on file  Social Connections: Not on file  Intimate Partner Violence: Not on file    No Known Allergies  History reviewed. No pertinent family history.  Prior to Admission medications   Medication Sig Start Date End Date Taking? Authorizing Provider  amLODipine (NORVASC) 10 MG tablet Take 10 mg by mouth daily.  08/18/16   [provider]  Cholecalciferol (VITAMIN D3 SUPER STRENGTH) 50 MCG (2000 UT) CAPS Take 2,000 Units by mouth daily.    [provider]  losartan (COZAAR) 100 MG tablet Take 100 mg by mouth daily.  08/18/16   [provider]  metoprolol tartrate (LOPRESSOR) 25 MG tablet Take 25 mg by mouth 2 (two) times daily.    [provider]  pravastatin (PRAVACHOL) 40 MG tablet Take 40 mg by mouth daily.  08/18/16   [provider]  traMADol (ULTRAM) 50 MG tablet Take 1-2 tablets (50-100 mg total) by mouth every 6 (six) hours as needed. 12/05/18   Armandina Gemma, MD    Physical Exam: Vitals:   09/01/21 1000 09/01/21 1015 09/01/21 1215 09/01/21 1217  BP:   (!) 154/97   Pulse: (!) 58 (!) 56 63    Resp: 16 15 18    Temp:    98.6 F (37 C)  TempSrc:    Oral  SpO2: 95% 94% 97%   Weight:      Height:         General:  Appears calm and comfortable and is in NAD Eyes:  PERRL, EOMI, normal lids, iris ENT:  grossly normal hearing, lips & tongue, mmm; edentulous Neck:  no LAD, masses or thyromegaly Cardiovascular:  RRR, no m/r/g. No LE edema.  Respiratory:   CTA bilaterally with no wheezes/rales/rhonchi.  Normal respiratory effort. Abdomen:  soft, RLQ/R flank TTP, ND Skin:  no rash or induration seen on limited exam Musculoskeletal:  grossly normal tone BUE/BLE, good ROM, no bony abnormality Psychiatric:  blunted mood and affect, speech fluent and appropriate, AOx3 Neurologic:  CN 2-12 grossly intact, moves all extremities in coordinated fashion    Radiological Exams on Admission: Independently reviewed - see discussion in A/P where applicable  CT ABDOMEN PELVIS WO CONTRAST  Result Date: 09/01/2021 CLINICAL DATA:  Right lower quadrant pain, flank pain EXAM: CT ABDOMEN AND PELVIS WITHOUT CONTRAST TECHNIQUE: Multidetector CT imaging of the abdomen and pelvis was performed following the standard protocol without IV contrast. COMPARISON:  None. FINDINGS: Lower chest: No acute abnormality. Hepatobiliary: No focal hepatic abnormality. Gallbladder unremarkable. Pancreas: No focal abnormality or ductal dilatation. Spleen: No focal abnormality.  Normal size. Adrenals/Urinary Tract: Moderate right hydronephrosis and perinephric stranding due to 5 mm proximal right ureteral stone. Small nonobstructing stone in the lower pole of the right kidney. No stones or hydronephrosis on the left. Urinary bladder and adrenal glands unremarkable. Stomach/Bowel: Stomach, large and small bowel grossly unremarkable. Normal appendix. Vascular/Lymphatic: Calcified aorta and iliac vessels. No evidence of aneurysm or adenopathy. Reproductive: Prior hysterectomy.  No adnexal masses. Other: No free fluid or free air.  Musculoskeletal: No acute bony abnormality. IMPRESSION: 5 mm proximal right ureteral stone with right hydronephrosis and perinephric stranding. Right lower pole nephrolithiasis. Aortoiliac atherosclerosis. Electronically Signed   By: Rolm Baptise M.D.   On: 09/01/2021 09:34    EKG: Independently reviewed.  NSR with rate 79; no evidence of acute ischemia   Labs on Admission: I have personally reviewed the available labs and imaging studies at the time of the admission.  Pertinent labs:   BUN 14/Creatinine 1.62/GFR 35 WBC 13.6 UA: moderate Hgb, 5 ketones, small Le, >300 protein, rare bacteria, >50 WBC  Assessment/Plan Principal Problem:   Complicated UTI (urinary tract infection) Active Problems:   Hyperparathyroidism ([place])   Nephrolithiasis   Essential hypertension   Dyslipidemia   Class 1 obesity due to excess calories with body  mass index (BMI) of 34.0 to 34.9 in adult   Nephrolithiasis with UTI -Patient without h/o prior presenting with B flank pain -On exam, appears to have RLQ/R flank pain which correlates with imaging of 5 mm proximal R ureteral stone with hydronephrosis and perinephric stranding -UA is abnormal, concerning for concomitant UTI -Will admit to [facility] -Urology consult, Dr. Abner Greenspan will see -Rocephin -Urine culture is pending  AKI -Normal creatinine/GFR in 2019 -Likely associated with current infection -Hold Cozaar  HTN -Continue home meds - Norvasc, Lopressor -Hold Cozaar for now  HLD -Continue Pravachol  Hyperparathyroidism -s/p parathyroidectomy -Overdue for endocrinology consult -Ca++ minimally elevated today at 10.6  Obesity -Body mass index is 34.95 kg/m..  -Weight loss should be encouraged -Outpatient PCP/bariatric medicine f/u encouraged     Note: This patient has been tested and is negative for the novel coronavirus COVID-19. The patient has been at least partially vaccinated against COVID-19.   Level of care: Med-Surg DVT prophylaxis:   SCDs Code Status:  Full - confirmed with patient Family Communication: None present Disposition Plan:  The patient is from: home  Anticipated d/c is to: home without [facility] services once her cardiology issues have been resolved.  Anticipated d/c date will depend on clinical response to treatment, likely 2-3 days  Patient is currently: acutely ill Consults called: Urology  Admission status:  Admit - It is my clinical opinion that admission to INPATIENT is reasonable and necessary because of the expectation that this patient will require hospital care that crosses at least 2 midnights to treat this condition based on the medical complexity of the problems presented.  Given the aforementioned information, the predictability of an adverse outcome is felt to be significant.    Karmen Bongo MD [facility]   How to contact the [facility] Attending or Consulting provider [place] or covering provider during after hours [place], for this patient?  Check the care team in [facility] and look for a) attending/consulting [facility] provider listed and b) the [facility] team listed Log into [email or web address] and use [place]'s universal password to access. If you do not have the password, please contact the hospital operator. Locate the [facility] provider you are looking for under [facility] and page to a number that you can be directly reached. If you still have difficulty reaching the provider, please page the [facility] (Director on Call) for the Hospitalists listed on amion for assistance.   09/01/2021, 1:29 PM

## 2021-09-01 NOTE — Consult Note (Signed)
Urology Consult   Physician requesting consult: Karmen Bongo, MD  Reason for consult: Obstructing right ureteral stone  History of Present Illness: Sherry Arroyo is a 65 y.o. with a past medical history significant for hypertension, primary hyperparathyroidism who presented to the ED on 09/01/2021 with right lower quadrant abdominal pain. She denied having fevers.  She denied dysuria.  Her white blood cell count was 13.6.  Urinalysis demonstrated negative nitrites.  CT A/P 09/01/2021 demonstrated 5 mm proximal right ureteral stone with right hydronephrosis and perinephric stranding.  She also had right lower pole stones which were small.  She was admitted to internal medicine without talking to urology prior for concern of infected stone with plans for ureteral stent.  She denies a prior history of urolithiasis.  Past Medical History:  Diagnosis Date   Chronic kidney disease (CKD), stage II (mild)    pt denies at preop   Class 1 obesity due to excess calories with body mass index (BMI) of 34.0 to 34.9 in adult 09/01/2021   Dyspnea    Hypercalcemia    Hypertension    Primary hyperparathyroidism [facility])    endocrinologist-- dr Cruzita Lederer   Vitamin D deficiency     Past Surgical History:  Procedure Laterality Date   LAPAROSCOPIC ASSISTED VAGINAL HYSTERECTOMY  09-25-2002    dr Charlesetta Garibaldi  @[facility]    W/  BILATERAL SALPINGO-OOPHORECTOMY   MINIMALLY INVASIVE RADIOACTIVE PARATHYROIDECTOMY  11-03-2005     dr Debbora Presto  @[facility]    PARATHYROIDECTOMY Right 12/05/2018   Procedure: RIGHT SUPERIOR PARATHYROIDECTOMY;  Surgeon: Armandina Gemma, MD;  Location: [facility];  Service: General;  Laterality: Right;   right inferior parathyroidectomy     Dr. Harlow Asa 12-05-2018     Current Hospital Medications:  Home meds:  No current facility-administered medications on file prior to encounter.   Current Outpatient Medications on File Prior to Encounter  Medication Sig Dispense Refill   amLODipine (NORVASC) 10 MG tablet Take 10  mg by mouth daily.   0   losartan-hydrochlorothiazide (HYZAAR) 100-25 MG tablet Take 1 tablet by mouth daily.     metoprolol succinate (TOPROL-XL) 100 MG 24 hr tablet Take 100 mg by mouth daily.     pravastatin (PRAVACHOL) 40 MG tablet Take 40 mg by mouth daily.   0   spironolactone (ALDACTONE) 25 MG tablet Take 25 mg by mouth daily.       Scheduled Meds:  [MAR Hold] amLODipine  10 mg Oral Daily   [MAR Hold] docusate sodium  100 mg Oral BID   [MAR Hold] metoprolol tartrate  25 mg Oral BID   metoprolol tartrate  25 mg Oral NOW   [MAR Hold] pravastatin  40 mg Oral Daily   Continuous Infusions:  [MAR Hold] cefTRIAXone (ROCEPHIN)  IV     lactated ringers     lactated ringers 75 mL/hr at 09/01/21 1534   PRN Meds:.[MAR Hold] acetaminophen **OR** [MAR Hold] acetaminophen, [MAR Hold] bisacodyl, [MAR Hold] hydrALAZINE, [MAR Hold] HYDROcodone-acetaminophen, [MAR Hold]  morphine injection, [MAR Hold] ondansetron **OR** [MAR Hold] ondansetron (ZOFRAN) IV, [MAR Hold] polyethylene glycol  Allergies: No Known Allergies  History reviewed. No pertinent family history.  Social History:  reports that she has been smoking cigarettes. She has a 25.00 pack-year smoking history. She has never used smokeless tobacco. She reports current alcohol use of about 1.0 standard drink per week. She reports that she does not use drugs.  ROS: A complete review of systems was performed.  All systems are negative except for pertinent  findings as noted.  Physical Exam:  Vital signs in last 24 hours: Temp:  [97.6 F (36.4 C)-98.9 F (37.2 C)] 98.1 F (36.7 C) (09/29 1521) Pulse Rate:  [56-99] 65 (09/29 1522) Resp:  [14-26] 22 (09/29 1522) BP: (138-212)/(93-147) 173/99 (09/29 1522) SpO2:  [88 %-100 %] 99 % (09/29 1600) Weight:  [95.3 kg] 95.3 kg (09/29 1511) Constitutional:  Alert and oriented, No acute distress Cardiovascular: Regular rate and rhythm Respiratory: Normal respiratory effort, Lungs clear  bilaterally GI: Abdomen is soft, nontender, nondistended, no abdominal masses GU: No CVA tenderness Neurologic: Grossly intact, no focal deficits Psychiatric: Normal mood and affect  Laboratory Data:  Recent Labs    08/31/21 2000  WBC 13.6*  HGB 14.3  HCT 44.5  PLT 254    Recent Labs    08/31/21 2000  NA 136  K 3.9  CL 100  GLUCOSE 101*  BUN 14  CALCIUM 10.6*  CREATININE 1.62*     Results for orders placed or performed during the hospital encounter of 08/31/21 (from the past 24 hour(s))  Urinalysis, Routine w reflex microscopic Urine, Clean Catch     Status: Abnormal   Collection Time: 08/31/21  6:44 PM  Result Value Ref Range   Color, Urine YELLOW YELLOW   APPearance CLOUDY (A) CLEAR   Specific Gravity, Urine 1.024 1.005 - 1.030   pH 5.0 5.0 - 8.0   Glucose, UA NEGATIVE NEGATIVE mg/dL   Hgb urine dipstick MODERATE (A) NEGATIVE   Bilirubin Urine NEGATIVE NEGATIVE   Ketones, ur 5 (A) NEGATIVE mg/dL   Protein, ur >=300 (A) NEGATIVE mg/dL   Nitrite NEGATIVE NEGATIVE   Leukocytes,Ua SMALL (A) NEGATIVE   RBC / HPF 11-20 0 - 5 RBC/hpf   WBC, UA >50 (H) 0 - 5 WBC/hpf   Bacteria, UA RARE (A) NONE SEEN   Squamous Epithelial / LPF 21-50 0 - 5   Mucus PRESENT   Comprehensive metabolic panel     Status: Abnormal   Collection Time: 08/31/21  8:00 PM  Result Value Ref Range   Sodium 136 135 - 145 mmol/L   Potassium 3.9 3.5 - 5.1 mmol/L   Chloride 100 98 - 111 mmol/L   CO2 25 22 - 32 mmol/L   Glucose, Bld 101 (H) 70 - 99 mg/dL   BUN 14 8 - 23 mg/dL   Creatinine, Ser 1.62 (H) 0.44 - 1.00 mg/dL   Calcium 10.6 (H) 8.9 - 10.3 mg/dL   Total Protein 8.1 6.5 - 8.1 g/dL   Albumin 4.3 3.5 - 5.0 g/dL   AST 24 15 - 41 U/L   ALT 21 0 - 44 U/L   Alkaline Phosphatase 63 38 - 126 U/L   Total Bilirubin 1.0 0.3 - 1.2 mg/dL   GFR, Estimated 35 (L) >60 mL/min   Anion gap 11 5 - 15  Lipase, blood     Status: None   Collection Time: 08/31/21  8:00 PM  Result Value Ref Range    Lipase 27 11 - 51 U/L  CBC with Differential     Status: Abnormal   Collection Time: 08/31/21  8:00 PM  Result Value Ref Range   WBC 13.6 (H) 4.0 - 10.5 K/uL   RBC 4.83 3.87 - 5.11 MIL/uL   Hemoglobin 14.3 12.0 - 15.0 g/dL   HCT 44.5 36.0 - 46.0 %   MCV 92.1 80.0 - 100.0 fL   MCH 29.6 26.0 - 34.0 pg   MCHC 32.1 30.0 - 36.0 g/dL  RDW 15.7 (H) 11.5 - 15.5 %   Platelets 254 150 - 400 K/uL   nRBC 0.0 0.0 - 0.2 %   Neutrophils Relative % 75 %   Neutro Abs 10.1 (H) 1.7 - 7.7 K/uL   Lymphocytes Relative 16 %   Lymphs Abs 2.1 0.7 - 4.0 K/uL   Monocytes Relative 9 %   Monocytes Absolute 1.2 (H) 0.1 - 1.0 K/uL   Eosinophils Relative 0 %   Eosinophils Absolute 0.1 0.0 - 0.5 K/uL   Basophils Relative 0 %   Basophils Absolute 0.1 0.0 - 0.1 K/uL   Immature Granulocytes 0 %   Abs Immature Granulocytes 0.06 0.00 - 0.07 K/uL  Resp Panel by RT-PCR (Flu A&B, Covid) Nasopharyngeal Swab     Status: None   Collection Time: 09/01/21  2:09 PM   Specimen: Nasopharyngeal Swab; Nasopharyngeal(NP) swabs in vial transport medium  Result Value Ref Range   SARS Coronavirus 2 by RT PCR NEGATIVE NEGATIVE   Influenza A by PCR NEGATIVE NEGATIVE   Influenza B by PCR NEGATIVE NEGATIVE   Recent Results (from the past 240 hour(s))  Resp Panel by RT-PCR (Flu A&B, Covid) Nasopharyngeal Swab     Status: None   Collection Time: 09/01/21  2:09 PM   Specimen: Nasopharyngeal Swab; Nasopharyngeal(NP) swabs in vial transport medium  Result Value Ref Range Status   SARS Coronavirus 2 by RT PCR NEGATIVE NEGATIVE Final    Comment: (NOTE) SARS-CoV-2 target nucleic acids are NOT DETECTED.  The SARS-CoV-2 RNA is generally detectable in upper respiratory specimens during the acute phase of infection. The lowest concentration of SARS-CoV-2 viral copies this assay can detect is 138 copies/mL. A negative result does not preclude SARS-Cov-2 infection and should not be used as the sole basis for treatment or other patient  management decisions. A negative result may occur with  improper specimen collection/handling, submission of specimen other than nasopharyngeal swab, presence of viral mutation(s) within the areas targeted by this assay, and inadequate number of viral copies(<138 copies/mL). A negative result must be combined with clinical observations, patient history, and epidemiological information. The expected result is Negative.  Fact Sheet for Patients:  EntrepreneurPulse.com.au  Fact Sheet for Healthcare Providers:  IncredibleEmployment.be  This test is no t yet approved or cleared by the [place] FDA and  has been authorized for detection and/or diagnosis of SARS-CoV-2 by FDA under an Emergency Use Authorization (EUA). This EUA will remain  in effect (meaning this test can be used) for the duration of the COVID-19 declaration under Section 564(b)(1) of the Act, 21 U.S.C.section 360bbb-3(b)(1), unless the authorization is terminated  or revoked sooner.       Influenza A by PCR NEGATIVE NEGATIVE Final   Influenza B by PCR NEGATIVE NEGATIVE Final    Comment: (NOTE) The Xpert Xpress SARS-CoV-2/FLU/RSV plus assay is intended as an aid in the diagnosis of influenza from Nasopharyngeal swab specimens and should not be used as a sole basis for treatment. Nasal washings and aspirates are unacceptable for Xpert Xpress SARS-CoV-2/FLU/RSV testing.  Fact Sheet for Patients: EntrepreneurPulse.com.au  Fact Sheet for Healthcare Providers: IncredibleEmployment.be  This test is not yet approved or cleared by the [place] FDA and has been authorized for detection and/or diagnosis of SARS-CoV-2 by FDA under an Emergency Use Authorization (EUA). This EUA will remain in effect (meaning this test can be used) for the duration of the COVID-19 declaration under Section 564(b)(1) of the Act, 21 U.S.C. section 360bbb-3(b)(1),  unless the authorization  is terminated or revoked.  Performed at [facility], [place] [address] [place], [place] [address]     Renal Function: Recent Labs    08/31/21 2000  CREATININE 1.62*   Estimated Creatinine Clearance: 39.5 mL/min (A) (by C-G formula based on SCr of 1.62 mg/dL (H)).  Radiologic Imaging: CT ABDOMEN PELVIS WO CONTRAST  Result Date: 09/01/2021 CLINICAL DATA:  Right lower quadrant pain, flank pain EXAM: CT ABDOMEN AND PELVIS WITHOUT CONTRAST TECHNIQUE: Multidetector CT imaging of the abdomen and pelvis was performed following the standard protocol without IV contrast. COMPARISON:  None. FINDINGS: Lower chest: No acute abnormality. Hepatobiliary: No focal hepatic abnormality. Gallbladder unremarkable. Pancreas: No focal abnormality or ductal dilatation. Spleen: No focal abnormality.  Normal size. Adrenals/Urinary Tract: Moderate right hydronephrosis and perinephric stranding due to 5 mm proximal right ureteral stone. Small nonobstructing stone in the lower pole of the right kidney. No stones or hydronephrosis on the left. Urinary bladder and adrenal glands unremarkable. Stomach/Bowel: Stomach, large and small bowel grossly unremarkable. Normal appendix. Vascular/Lymphatic: Calcified aorta and iliac vessels. No evidence of aneurysm or adenopathy. Reproductive: Prior hysterectomy.  No adnexal masses. Other: No free fluid or free air. Musculoskeletal: No acute bony abnormality. IMPRESSION: 5 mm proximal right ureteral stone with right hydronephrosis and perinephric stranding. Right lower pole nephrolithiasis. Aortoiliac atherosclerosis. Electronically Signed   By: Rolm Baptise M.D.   On: 09/01/2021 09:34    I independently reviewed the above imaging studies.  Impression/Recommendation: Obstructing right ureteral stone: CT A/P 09/02/2019 with 5 mm proximal right ureteral stone with right hydronephrosis and perinephric stranding.  Afebrile.  WBC 13.6.  Urinalysis  with negative nitrite.  Urine culture pending.  Creatinine 1.62 from baseline of 0.84. AKI  -The risks, benefits and alternatives of cystoscopy with right JJ stent placement was discussed with the patient.  Risks include, but are not limited to: bleeding, urinary tract infection, ureteral injury, ureteral stricture disease, chronic pain, urinary symptoms, bladder injury, stent migration, the need for nephrostomy tube placement, MI, CVA, DVT, PE and the inherent risks with general anesthesia.  The patient voices understanding and wishes to proceed.  -I will arrange for outpatient follow-up for definitive treatment of her stone. -Follow-up urine culture.  She will need to be discharged home with culture specific antibiotics.  This can likely be done tomorrow.  Matt R. Reagan Behlke MD 09/01/2021, 5:29 PM  [facility]  Pager: [phone]   CC: Karmen Bongo, MD

## 2021-09-01 NOTE — Procedures (Deleted)
Operative Note  Preoperative diagnosis:  1.  Right obstructing ureteral stone 2. UTI 3. AKI  Postoperative diagnosis: 1.  Right obstructing ureteral stone 2. UTI 3. AKI  Procedure(s): 1.  Cystoscopy 2. Right retrograde pyelogram with interpretation 3. Right ureteral stent placement 4. Fluoroscopy <1 hour with intraoperative interpretation  Surgeon: Rexene Alberts, MD  Assistants:  None  Anesthesia:  General  Complications:  None  EBL:  Minimal  Specimens: 1. Right renal pelvis urine culture  Drains/Catheters: 1.  Right 6Fr x 26cm ureteral stent  Intraoperative findings:   Cystoscopy demonstrated no suspicious lesions, masses, stones or other pathology. Right retrograde pyelogram demonstrated moderate right hydronephrosis. Successful right ureteral stent placement with curl in the renal pelvis and bladder respectively.  Indication:  Sherry Arroyo is a 65 y.o. female with CT A/P 09/01/2021 demonstrating 57mm proximal right ureteral stone with sign of UTI. After reviewing the management options for treatment, she elected to proceed with the above surgical procedure(s). We have discussed the potential benefits and risks of the procedure, side effects of the proposed treatment, the likelihood of the patient achieving the goals of the procedure, and any potential problems that might occur during the procedure or recuperation. Informed consent has been obtained.  Description of procedure: The patient was taken to the operating room and general anesthesia was induced.  The patient was placed in the dorsal lithotomy position, prepped and draped in the usual sterile fashion, and preoperative antibiotics were administered. A preoperative time-out was performed.   Cystourethroscopy was performed.  The patient's urethra was examined and was normal. The bladder was then systematically examined in its entirety. There was no evidence for any bladder tumors, stones, or other mucosal  pathology.    Attention then turned to the right ureteral orifice. A 0.038 zip wire was passed through the right orifice and over the wire a 5 Fr open ended catheter was inserted and passed up to the level of the renal pelvis. There was a hydronephrotic drip and this was sent for a right renal pelvis urine for culture. Omnipaque contrast was injected through the ureteral catheter and a retrograde pyelogram was performed with findings as dictated above. The wire was then replaced and the open ended catheter was removed.   A 6Fr x 26cm ureteral stent was advance over the wire. The stent was positioned appropriately under fluoroscopic and cystoscopic guidance.  The wire was then removed with an adequate stent curl noted in the right upper pole as well as in the bladder.  The bladder was then emptied and the procedure ended.  The patient appeared to tolerate the procedure well and without complications.  The patient was able to be awakened and transferred to the recovery unit in satisfactory condition.   Plan:  F/u urine culture to ensure sensitive. Ok to discharge home on culture specific antibiotics. Will arrange for followup to treat stone outpatient.  [facility]  Pager: [phone]

## 2021-09-02 ENCOUNTER — Encounter (HOSPITAL_COMMUNITY): Payer: Self-pay | Admitting: Urology

## 2021-09-02 DIAGNOSIS — I10 Essential (primary) hypertension: Secondary | ICD-10-CM | POA: Diagnosis not present

## 2021-09-02 DIAGNOSIS — N139 Obstructive and reflux uropathy, unspecified: Secondary | ICD-10-CM | POA: Diagnosis not present

## 2021-09-02 DIAGNOSIS — N179 Acute kidney failure, unspecified: Secondary | ICD-10-CM

## 2021-09-02 DIAGNOSIS — N390 Urinary tract infection, site not specified: Secondary | ICD-10-CM | POA: Diagnosis not present

## 2021-09-02 DIAGNOSIS — N136 Pyonephrosis: Secondary | ICD-10-CM | POA: Diagnosis not present

## 2021-09-02 LAB — BASIC METABOLIC PANEL
Anion gap: 7 (ref 5–15)
BUN: 21 mg/dL (ref 8–23)
CO2: 26 mmol/L (ref 22–32)
Calcium: 9.9 mg/dL (ref 8.9–10.3)
Chloride: 103 mmol/L (ref 98–111)
Creatinine, Ser: 1.39 mg/dL — ABNORMAL HIGH (ref 0.44–1.00)
GFR, Estimated: 42 mL/min — ABNORMAL LOW (ref >60)
Glucose, Bld: 134 mg/dL — ABNORMAL HIGH (ref 70–99)
Potassium: 4.2 mmol/L (ref 3.5–5.1)
Sodium: 136 mmol/L (ref 135–145)

## 2021-09-02 LAB — URINE CULTURE: Culture: NO GROWTH

## 2021-09-02 LAB — CBC
HCT: 41 % (ref 36.0–46.0)
Hemoglobin: 12.9 g/dL (ref 12.0–15.0)
MCH: 29.3 pg (ref 26.0–34.0)
MCHC: 31.5 g/dL (ref 30.0–36.0)
MCV: 93.2 fL (ref 80.0–100.0)
Platelets: 217 10*3/uL (ref 150–400)
RBC: 4.4 MIL/uL (ref 3.87–5.11)
RDW: 15.5 % (ref 11.5–15.5)
WBC: 10.3 10*3/uL (ref 4.0–10.5)
nRBC: 0 % (ref 0.0–0.2)

## 2021-09-02 MED ORDER — ENOXAPARIN SODIUM 40 MG/0.4ML IJ SOSY
40.0000 mg | PREFILLED_SYRINGE | INTRAMUSCULAR | Status: DC
  Administered 2021-09-02: 40 mg via SUBCUTANEOUS
  Filled 2021-09-02: qty 0.4

## 2021-09-02 MED ORDER — ENSURE MAX PROTEIN PO LIQD
11.0000 [oz_av] | Freq: Every day | ORAL | Status: DC
  Administered 2021-09-02 – 2021-09-03 (×2): 11 [oz_av] via ORAL
  Filled 2021-09-02 (×3): qty 330

## 2021-09-02 NOTE — Progress Notes (Signed)
Initial Nutrition Assessment  DOCUMENTATION CODES:   Obesity unspecified  INTERVENTION:   -Ensure MAX Protein po BID, each supplement provides 150 kcal and 30 grams of protein   -Encouraged consistent intakes  -Reviewed strategies to help with taste loss  NUTRITION DIAGNOSIS:   Inadequate oral intake related to poor appetite as evidenced by per patient/family report.  GOAL:   Patient will meet greater than or equal to 90% of their needs  MONITOR:   PO intake, Supplement acceptance, Labs, Weight trends, I & O's  REASON FOR ASSESSMENT:   Consult Assessment of nutrition requirement/status  ASSESSMENT:   65 y.o. female with medical history significant of HTN and primary hyperparathyroidism presenting with RLQ pain.  Evaluation in the ED showed obstructing right ureteral stone.  Patient in room, states she has poor appetite and foods are not tasting good, more bland than usual. Denies ever having COVID. Pt did not eat much of breakfast and lunch d/t poor taste. Agreeable to trying Ensure supplements while intakes are low.  Reviewed strategies to help with taste changes. Encouraged consistent PO intakes and to not skip meals.  Pt denies any weight changes recently and nothing noted in weight records.  Medications: Colace, Lactated ringers  Labs reviewed.  NUTRITION - FOCUSED PHYSICAL EXAM:  No depletions noted.  Diet Order:   Diet Order             Diet heart healthy/carb modified Room service appropriate? Yes; Fluid consistency: Thin  Diet effective now                   EDUCATION NEEDS:   No education needs have been identified at this time  Skin:  Skin Assessment: Reviewed RN Assessment  Last BM:  9/28  Height:   Ht Readings from Last 1 Encounters:  09/01/21 5\' 5"  (1.651 m)    Weight:   Wt Readings from Last 1 Encounters:  09/01/21 95.3 kg    BMI:  Body mass index is 34.96 kg/m.  Estimated Nutritional Needs:   Kcal:   1700-1900  Protein:  95-110g  Fluid:  1.9L/day  Sherry Bibles, MS, RD, LDN Inpatient Clinical Dietitian Contact information available via Amion

## 2021-09-02 NOTE — Progress Notes (Signed)
[facility] PROGRESS NOTE   Sherry Arroyo DGU:[id number] DOB: 07/19/1956 DOA: 08/31/2021  PCP: Charolette Forward, MD  Brief History/Interval Summary: 65 y.o. female with medical history significant of HTN and primary hyperparathyroidism presenting with RLQ pain.  Evaluation in the ED showed obstructing right ureteral stone.  Patient was hospitalized for further management.    Consultants: Urology  Procedures:    1.  Cystoscopy 2. Right retrograde pyelogram with interpretation 3. Right ureteral stent placement 4. Fluoroscopy <1 hour with intraoperative interpretation    Subjective/Interval History: Patient complains of mild pain in her right side of the abdomen as well as the flank area but much better compared to yesterday.  No nausea vomiting.  Requesting advancement in diet.     Assessment/Plan:  Right ureterolithiasis with hydronephrosis/acute kidney injury due to obstructive uropathy Presented with creatinine of 1.62.  Likely due to obstruction.  Patient underwent a ureteral stent placement on 9/29.  Renal function noted to be improving.  Continue to monitor.  Continue IV fluids for another day.  Will decrease the rate to 75 mill per hour.  Urinary tract infection UA was noted to be abnormal.  Patient had leukocytosis.  Cultures are pending.  Patient remains on ceftriaxone.  Continue to monitor.  Essential hypertension Amlodipine and metoprolol being continued.  Cozaar is on hold currently.  Monitor blood pressures.  Hyperlipidemia Continue statin.  Hyperparathyroidism High calcium level from yesterday was likely due to dehydration.  Noted to be 9.9 today.  Continue to monitor.  Follow-up with her endocrinologist.  Obesity Estimated body mass index is 34.96 kg/m as calculated from the following:   Height as of this encounter: 5\' 5"  (1.651 m).   Weight as of this encounter: 95.3 kg.   DVT Prophylaxis: SCDs.  Okay to initiate Lovenox. Code Status: Full  code Family Communication: Discussed with the patient Disposition Plan: Mobilize.  Hopefully discharge back home in 24 to 48 hours.  Status is: Inpatient  Remains inpatient appropriate because:IV treatments appropriate due to intensity of illness or inability to take PO and Inpatient level of care appropriate due to severity of illness  Dispo: The patient is from: Home              Anticipated d/c is to: Home              Patient currently is not medically stable to d/c.   Difficult to place patient No     Medications: Scheduled:  amLODipine  10 mg Oral Daily   docusate sodium  100 mg Oral BID   metoprolol tartrate  25 mg Oral BID   metoprolol tartrate  25 mg Oral NOW   pravastatin  40 mg Oral Daily   Continuous:  cefTRIAXone (ROCEPHIN)  IV 1 g (09/02/21 0753)   lactated ringers Stopped (09/01/21 1823)   lactated ringers 75 mL/hr at 09/01/21 1534   ZDG:LOVFIEPPIRJJO **OR** acetaminophen, bisacodyl, hydrALAZINE, HYDROcodone-acetaminophen, morphine injection, ondansetron **OR** ondansetron (ZOFRAN) IV, polyethylene glycol  Antibiotics: Anti-infectives (From admission, onward)    Start     Dose/Rate Route Frequency Ordered Stop   09/02/21 0700  cefTRIAXone (ROCEPHIN) 1 g in sodium chloride 0.9 % 100 mL IVPB        1 g 200 mL/hr over 30 Minutes Intravenous Every 24 hours 09/01/21 1232     09/01/21 1738  ceFAZolin (ANCEF) 2-4 GM/100ML-% IVPB       Note to Pharmacy: Georgena Spurling   : cabinet override  09/01/21 1738 09/02/21 0544   09/01/21 0830  cefTRIAXone (ROCEPHIN) 1 g in sodium chloride 0.9 % 100 mL IVPB        1 g 200 mL/hr over 30 Minutes Intravenous  Once 09/01/21 0820 09/01/21 0948       Objective:  Vital Signs  Vitals:   09/01/21 1845 09/01/21 2140 09/02/21 0141 09/02/21 0444  BP: (!) 180/106 (!) 164/123 125/77 (!) 163/93  Pulse: (!) 58 83 71 63  Resp: 19 18 20 18   Temp: 97.8 F (36.6 C) 98.7 F (37.1 C) 98.6 F (37 C) 98.5 F (36.9 C)  TempSrc:   Oral Oral Oral  SpO2: 95% (!) 89% (!) 89% 92%  Weight:      Height:        Intake/Output Summary (Last 24 hours) at 09/02/2021 0907 Last data filed at 09/01/2021 1823 Gross per 24 hour  Intake 1750 ml  Output --  Net 1750 ml   Filed Weights   08/31/21 1843 09/01/21 1511  Weight: 95.3 kg 95.3 kg    General appearance: Awake alert.  In no distress Resp: Clear to auscultation bilaterally.  Normal effort Cardio: S1-S2 is normal regular.  No S3-S4.  No rubs murmurs or bruit GI: Abdomen is soft.  Mildly tender in the right side without any rebound rigidity or guarding.  No masses organomegaly.  Bowel sounds present normal. Extremities: No edema.  Full range of motion of lower extremities. Neurologic: Alert and oriented x3.  No focal neurological deficits.    Lab Results:  Data Reviewed: I have personally reviewed following labs and imaging studies  CBC: Recent Labs  Lab 08/31/21 2000 09/02/21 0508  WBC 13.6* 10.3  NEUTROABS 10.1*  --   HGB 14.3 12.9  HCT 44.5 41.0  MCV 92.1 93.2  PLT 254 347    Basic Metabolic Panel: Recent Labs  Lab 08/31/21 2000 09/02/21 0508  NA 136 136  K 3.9 4.2  CL 100 103  CO2 25 26  GLUCOSE 101* 134*  BUN 14 21  CREATININE 1.62* 1.39*  CALCIUM 10.6* 9.9    GFR: Estimated Creatinine Clearance: 46.1 mL/min (A) (by C-G formula based on SCr of 1.39 mg/dL (H)).  Liver Function Tests: Recent Labs  Lab 08/31/21 2000  AST 24  ALT 21  ALKPHOS 63  BILITOT 1.0  PROT 8.1  ALBUMIN 4.3    Recent Labs  Lab 08/31/21 2000  LIPASE 27    Recent Results (from the past 240 hour(s))  Urine Culture     Status: Abnormal   Collection Time: 08/31/21  6:14 PM   Specimen: Urine, Clean Catch  Result Value Ref Range Status   Specimen Description URINE, CLEAN CATCH  Final   Special Requests   Final    NONE Performed at [facility], [place] [address], [place], [place] [address]    Culture MULTIPLE SPECIES PRESENT, SUGGEST RECOLLECTION (A)   Final   Report Status 09/02/2021 FINAL  Final  Resp Panel by RT-PCR (Flu A&B, Covid) Nasopharyngeal Swab     Status: None   Collection Time: 09/01/21  2:09 PM   Specimen: Nasopharyngeal Swab; Nasopharyngeal(NP) swabs in vial transport medium  Result Value Ref Range Status   SARS Coronavirus 2 by RT PCR NEGATIVE NEGATIVE Final    Comment: (NOTE) SARS-CoV-2 target nucleic acids are NOT DETECTED.  The SARS-CoV-2 RNA is generally detectable in upper respiratory specimens during the acute phase of infection. The lowest concentration of SARS-CoV-2 viral copies this  assay can detect is 138 copies/mL. A negative result does not preclude SARS-Cov-2 infection and should not be used as the sole basis for treatment or other patient management decisions. A negative result may occur with  improper specimen collection/handling, submission of specimen other than nasopharyngeal swab, presence of viral mutation(s) within the areas targeted by this assay, and inadequate number of viral copies(<138 copies/mL). A negative result must be combined with clinical observations, patient history, and epidemiological information. The expected result is Negative.  Fact Sheet for Patients:  EntrepreneurPulse.com.au  Fact Sheet for Healthcare Providers:  IncredibleEmployment.be  This test is no t yet approved or cleared by the [place] FDA and  has been authorized for detection and/or diagnosis of SARS-CoV-2 by FDA under an Emergency Use Authorization (EUA). This EUA will remain  in effect (meaning this test can be used) for the duration of the COVID-19 declaration under Section 564(b)(1) of the Act, 21 U.S.C.section 360bbb-3(b)(1), unless the authorization is terminated  or revoked sooner.       Influenza A by PCR NEGATIVE NEGATIVE Final   Influenza B by PCR NEGATIVE NEGATIVE Final    Comment: (NOTE) The Xpert Xpress SARS-CoV-2/FLU/RSV plus assay is intended as an  aid in the diagnosis of influenza from Nasopharyngeal swab specimens and should not be used as a sole basis for treatment. Nasal washings and aspirates are unacceptable for Xpert Xpress SARS-CoV-2/FLU/RSV testing.  Fact Sheet for Patients: EntrepreneurPulse.com.au  Fact Sheet for Healthcare Providers: IncredibleEmployment.be  This test is not yet approved or cleared by the [place] FDA and has been authorized for detection and/or diagnosis of SARS-CoV-2 by FDA under an Emergency Use Authorization (EUA). This EUA will remain in effect (meaning this test can be used) for the duration of the COVID-19 declaration under Section 564(b)(1) of the Act, 21 U.S.C. section 360bbb-3(b)(1), unless the authorization is terminated or revoked.  Performed at [facility], [place] [address] [place], [place] [address]       Radiology Studies: CT ABDOMEN PELVIS WO CONTRAST  Result Date: 09/01/2021 CLINICAL DATA:  Right lower quadrant pain, flank pain EXAM: CT ABDOMEN AND PELVIS WITHOUT CONTRAST TECHNIQUE: Multidetector CT imaging of the abdomen and pelvis was performed following the standard protocol without IV contrast. COMPARISON:  None. FINDINGS: Lower chest: No acute abnormality. Hepatobiliary: No focal hepatic abnormality. Gallbladder unremarkable. Pancreas: No focal abnormality or ductal dilatation. Spleen: No focal abnormality.  Normal size. Adrenals/Urinary Tract: Moderate right hydronephrosis and perinephric stranding due to 5 mm proximal right ureteral stone. Small nonobstructing stone in the lower pole of the right kidney. No stones or hydronephrosis on the left. Urinary bladder and adrenal glands unremarkable. Stomach/Bowel: Stomach, large and small bowel grossly unremarkable. Normal appendix. Vascular/Lymphatic: Calcified aorta and iliac vessels. No evidence of aneurysm or adenopathy. Reproductive: Prior hysterectomy.  No adnexal masses.  Other: No free fluid or free air. Musculoskeletal: No acute bony abnormality. IMPRESSION: 5 mm proximal right ureteral stone with right hydronephrosis and perinephric stranding. Right lower pole nephrolithiasis. Aortoiliac atherosclerosis. Electronically Signed   By: Rolm Baptise M.D.   On: 09/01/2021 09:34   DG C-Arm 1-60 Min-No Report  Result Date: 09/01/2021 Fluoroscopy was utilized by the requesting physician.  No radiographic interpretation.       LOS: 1 day   Aquiles Ruffini [employer] on [email or web address]  09/02/2021, 9:07 AM

## 2021-09-03 DIAGNOSIS — E785 Hyperlipidemia, unspecified: Secondary | ICD-10-CM | POA: Diagnosis not present

## 2021-09-03 DIAGNOSIS — E559 Vitamin D deficiency, unspecified: Secondary | ICD-10-CM | POA: Diagnosis not present

## 2021-09-03 DIAGNOSIS — E210 Primary hyperparathyroidism: Secondary | ICD-10-CM | POA: Diagnosis not present

## 2021-09-03 DIAGNOSIS — N390 Urinary tract infection, site not specified: Secondary | ICD-10-CM | POA: Diagnosis not present

## 2021-09-03 DIAGNOSIS — Z90710 Acquired absence of both cervix and uterus: Secondary | ICD-10-CM | POA: Diagnosis not present

## 2021-09-03 DIAGNOSIS — N179 Acute kidney failure, unspecified: Secondary | ICD-10-CM | POA: Diagnosis not present

## 2021-09-03 DIAGNOSIS — N182 Chronic kidney disease, stage 2 (mild): Secondary | ICD-10-CM | POA: Diagnosis not present

## 2021-09-03 DIAGNOSIS — I129 Hypertensive chronic kidney disease with stage 1 through stage 4 chronic kidney disease, or unspecified chronic kidney disease: Secondary | ICD-10-CM | POA: Diagnosis not present

## 2021-09-03 DIAGNOSIS — F17210 Nicotine dependence, cigarettes, uncomplicated: Secondary | ICD-10-CM | POA: Diagnosis not present

## 2021-09-03 DIAGNOSIS — N136 Pyonephrosis: Secondary | ICD-10-CM | POA: Diagnosis present

## 2021-09-03 DIAGNOSIS — Z79899 Other long term (current) drug therapy: Secondary | ICD-10-CM | POA: Diagnosis not present

## 2021-09-03 DIAGNOSIS — E860 Dehydration: Secondary | ICD-10-CM | POA: Diagnosis not present

## 2021-09-03 DIAGNOSIS — Z6834 Body mass index (BMI) 34.0-34.9, adult: Secondary | ICD-10-CM | POA: Diagnosis not present

## 2021-09-03 DIAGNOSIS — E6609 Other obesity due to excess calories: Secondary | ICD-10-CM | POA: Diagnosis not present

## 2021-09-03 DIAGNOSIS — Z20822 Contact with and (suspected) exposure to covid-19: Secondary | ICD-10-CM | POA: Diagnosis not present

## 2021-09-03 LAB — BASIC METABOLIC PANEL
Anion gap: 10 (ref 5–15)
BUN: 23 mg/dL (ref 8–23)
CO2: 26 mmol/L (ref 22–32)
Calcium: 10.2 mg/dL (ref 8.9–10.3)
Chloride: 105 mmol/L (ref 98–111)
Creatinine, Ser: 1.19 mg/dL — ABNORMAL HIGH (ref 0.44–1.00)
GFR, Estimated: 51 mL/min — ABNORMAL LOW (ref >60)
Glucose, Bld: 106 mg/dL — ABNORMAL HIGH (ref 70–99)
Potassium: 4.2 mmol/L (ref 3.5–5.1)
Sodium: 141 mmol/L (ref 135–145)

## 2021-09-03 LAB — CBC
HCT: 37.1 % (ref 36.0–46.0)
Hemoglobin: 11.8 g/dL — ABNORMAL LOW (ref 12.0–15.0)
MCH: 29.5 pg (ref 26.0–34.0)
MCHC: 31.8 g/dL (ref 30.0–36.0)
MCV: 92.8 fL (ref 80.0–100.0)
Platelets: 224 10*3/uL (ref 150–400)
RBC: 4 MIL/uL (ref 3.87–5.11)
RDW: 15.1 % (ref 11.5–15.5)
WBC: 10.2 10*3/uL (ref 4.0–10.5)
nRBC: 0 % (ref 0.0–0.2)

## 2021-09-03 MED ORDER — LEVOFLOXACIN 750 MG PO TABS: 750.0000 mg | ORAL_TABLET | Freq: Every day | ORAL | 0 refills | Status: AC

## 2021-09-03 MED ORDER — SACCHAROMYCES BOULARDII 250 MG PO CAPS
250.0000 mg | ORAL_CAPSULE | Freq: Two times a day (BID) | ORAL | Status: DC
  Administered 2021-09-03: 250 mg via ORAL
  Filled 2021-09-03: qty 1

## 2021-09-03 MED ORDER — LEVOFLOXACIN 750 MG PO TABS
750.0000 mg | ORAL_TABLET | Freq: Every day | ORAL | Status: DC
  Administered 2021-09-03: 750 mg via ORAL
  Filled 2021-09-03: qty 1

## 2021-09-03 MED ORDER — SACCHAROMYCES BOULARDII 250 MG PO CAPS: 250.0000 mg | ORAL_CAPSULE | Freq: Two times a day (BID) | ORAL | 0 refills | Status: AC

## 2021-09-03 NOTE — Progress Notes (Signed)
Patient will be discharging home with family. Belongings were returned. Medication education will be provided.

## 2021-09-03 NOTE — Discharge Summary (Signed)
[facility]  Physician Discharge Summary   Patient ID: Sherry Arroyo MRN: [id number] DOB/AGE: 66-Aug-1957 65 y.o.  Admit date: 08/31/2021 Discharge date: 09/03/2021    PCP: Charolette Forward, MD  DISCHARGE DIAGNOSES:  Right ureterolithiasis with hydronephrosis status post ureteral stent placement Acute kidney injury due to obstructive uropathy Urinary tract infection Essential hypertension Hyperlipidemia Hyperparathyroidism Obesity  RECOMMENDATIONS FOR OUTPATIENT FOLLOW UP: Follow-up with urology    Home Health: None Equipment/Devices: None  CODE STATUS: Full code  DISCHARGE CONDITION: fair  Diet recommendation: As before  INITIAL HISTORY: 65 y.o. female with medical history significant of HTN and primary hyperparathyroidism presenting with RLQ pain.  Evaluation in the ED showed obstructing right ureteral stone.  Patient was hospitalized for further management.     Consultants: Urology   Procedures:     1.  Cystoscopy 2. Right retrograde pyelogram with interpretation 3. Right ureteral stent placement 4. Fluoroscopy <1 hour with intraoperative interpretation     HOSPITAL COURSE:   Right ureterolithiasis with hydronephrosis/acute kidney injury due to obstructive uropathy Presented with creatinine of 1.62.  Likely due to obstruction.  Patient underwent a ureteral stent placement on 9/29.  Renal function noted to be improving.  Almost back to baseline.  Does not have any significant hematuria.   Urinary tract infection UA was noted to be abnormal.  Patient had leukocytosis.  Started on antibiotics.  Cultures have been negative.  Will be discharged on levofloxacin.    Essential hypertension May resume home medications.  May resume ARB.  Hyperlipidemia Continue statin.  Hyperparathyroidism Elevated calcium level initially was likely due to dehydration.  Subsequently noted to be better.  She can follow-up with her endocrinologist.  Obesity Estimated  body mass index is 34.96 kg/m as calculated from the following:   Height as of this encounter: 5\' 5"  (1.651 m).   Weight as of this encounter: 95.3 kg.   Patient is stable.  Feels better.  Okay for discharge home today.   PERTINENT LABS:  The results of significant diagnostics from this hospitalization (including imaging, microbiology, ancillary and laboratory) are listed below for reference.    Microbiology: Recent Results (from the past 240 hour(s))  Urine Culture     Status: Abnormal   Collection Time: 08/31/21  6:14 PM   Specimen: Urine, Clean Catch  Result Value Ref Range Status   Specimen Description URINE, CLEAN CATCH  Final   Special Requests   Final    NONE Performed at [facility], [address], [place], [place] [address]    Culture MULTIPLE SPECIES PRESENT, SUGGEST RECOLLECTION (A)  Final   Report Status 09/02/2021 FINAL  Final  Resp Panel by RT-PCR (Flu A&B, Covid) Nasopharyngeal Swab     Status: None   Collection Time: 09/01/21  2:09 PM   Specimen: Nasopharyngeal Swab; Nasopharyngeal(NP) swabs in vial transport medium  Result Value Ref Range Status   SARS Coronavirus 2 by RT PCR NEGATIVE NEGATIVE Final    Comment: (NOTE) SARS-CoV-2 target nucleic acids are NOT DETECTED.  The SARS-CoV-2 RNA is generally detectable in upper respiratory specimens during the acute phase of infection. The lowest concentration of SARS-CoV-2 viral copies this assay can detect is 138 copies/mL. A negative result does not preclude SARS-Cov-2 infection and should not be used as the sole basis for treatment or other patient management decisions. A negative result may occur with  improper specimen collection/handling, submission of specimen other than nasopharyngeal swab, presence of viral mutation(s) within the areas targeted by  this assay, and inadequate number of viral copies(<138 copies/mL). A negative result must be combined with clinical observations, patient history, and  epidemiological information. The expected result is Negative.  Fact Sheet for Patients:  EntrepreneurPulse.com.au  Fact Sheet for Healthcare Providers:  IncredibleEmployment.be  This test is no t yet approved or cleared by the [place] FDA and  has been authorized for detection and/or diagnosis of SARS-CoV-2 by FDA under an Emergency Use Authorization (EUA). This EUA will remain  in effect (meaning this test can be used) for the duration of the COVID-19 declaration under Section 564(b)(1) of the Act, 21 U.S.C.section 360bbb-3(b)(1), unless the authorization is terminated  or revoked sooner.       Influenza A by PCR NEGATIVE NEGATIVE Final   Influenza B by PCR NEGATIVE NEGATIVE Final    Comment: (NOTE) The Xpert Xpress SARS-CoV-2/FLU/RSV plus assay is intended as an aid in the diagnosis of influenza from Nasopharyngeal swab specimens and should not be used as a sole basis for treatment. Nasal washings and aspirates are unacceptable for Xpert Xpress SARS-CoV-2/FLU/RSV testing.  Fact Sheet for Patients: EntrepreneurPulse.com.au  Fact Sheet for Healthcare Providers: IncredibleEmployment.be  This test is not yet approved or cleared by the [place] FDA and has been authorized for detection and/or diagnosis of SARS-CoV-2 by FDA under an Emergency Use Authorization (EUA). This EUA will remain in effect (meaning this test can be used) for the duration of the COVID-19 declaration under Section 564(b)(1) of the Act, 21 U.S.C. section 360bbb-3(b)(1), unless the authorization is terminated or revoked.  Performed at [facility], [place] [address], [place], [place] [address]   Urine Culture     Status: None   Collection Time: 09/01/21  6:22 PM   Specimen: PATH Cytology Urine  Result Value Ref Range Status   Specimen Description   Final    URINE, RANDOM RIGHT RENAL PELVIS Performed at  [place] [address] [place], [place] [address]    Special Requests   Final    NONE Performed at [facility], [place] [address] [place], [place] [address]    Culture   Final    NO GROWTH Performed at [facility], [place] [address], [place], [place] [address]    Report Status 09/02/2021 FINAL  Final     Labs:  COVID-19 Labs   Lab Results  Component Value Date   Ray NEGATIVE 09/01/2021   [place] Not Detected 12/22/2019      Basic Metabolic Panel: Recent Labs  Lab 08/31/21 2000 09/02/21 0508 09/03/21 0548  NA 136 136 141  K 3.9 4.2 4.2  CL 100 103 105  CO2 25 26 26   GLUCOSE 101* 134* 106*  BUN 14 21 23   CREATININE 1.62* 1.39* 1.19*  CALCIUM 10.6* 9.9 10.2   Liver Function Tests: Recent Labs  Lab 08/31/21 2000  AST 24  ALT 21  ALKPHOS 63  BILITOT 1.0  PROT 8.1  ALBUMIN 4.3   Recent Labs  Lab 08/31/21 2000  LIPASE 27    CBC: Recent Labs  Lab 08/31/21 2000 09/02/21 0508 09/03/21 0548  WBC 13.6* 10.3 10.2  NEUTROABS 10.1*  --   --   HGB 14.3 12.9 11.8*  HCT 44.5 41.0 37.1  MCV 92.1 93.2 92.8  PLT 254 217 224     IMAGING STUDIES CT ABDOMEN PELVIS WO CONTRAST  Result Date: 09/01/2021 CLINICAL DATA:  Right lower quadrant pain, flank pain EXAM: CT ABDOMEN AND PELVIS  WITHOUT CONTRAST TECHNIQUE: Multidetector CT imaging of the abdomen and pelvis was performed following the standard protocol without IV contrast. COMPARISON:  None. FINDINGS: Lower chest: No acute abnormality. Hepatobiliary: No focal hepatic abnormality. Gallbladder unremarkable. Pancreas: No focal abnormality or ductal dilatation. Spleen: No focal abnormality.  Normal size. Adrenals/Urinary Tract: Moderate right hydronephrosis and perinephric stranding due to 5 mm proximal right ureteral stone. Small nonobstructing stone in the lower pole of the right kidney. No stones or hydronephrosis on the left. Urinary bladder and adrenal  glands unremarkable. Stomach/Bowel: Stomach, large and small bowel grossly unremarkable. Normal appendix. Vascular/Lymphatic: Calcified aorta and iliac vessels. No evidence of aneurysm or adenopathy. Reproductive: Prior hysterectomy.  No adnexal masses. Other: No free fluid or free air. Musculoskeletal: No acute bony abnormality. IMPRESSION: 5 mm proximal right ureteral stone with right hydronephrosis and perinephric stranding. Right lower pole nephrolithiasis. Aortoiliac atherosclerosis. Electronically Signed   By: Rolm Baptise M.D.   On: 09/01/2021 09:34   DG C-Arm 1-60 Min-No Report  Result Date: 09/01/2021 Fluoroscopy was utilized by the requesting physician.  No radiographic interpretation.    DISCHARGE EXAMINATION: Vitals:   09/02/21 0444 09/02/21 1340 09/02/21 1944 09/03/21 0617  BP: (!) 163/93 (!) 159/95 (!) 142/79 (!) 166/87  Pulse: 63 70 72 60  Resp: 18 20 20 20   Temp: 98.5 F (36.9 C) 99.2 F (37.3 C) 98.3 F (36.8 C) 97.9 F (36.6 C)  TempSrc: Oral Oral Oral   SpO2: 92% 91% 95% 93%  Weight:      Height:       General appearance: Awake alert.  In no distress Resp: Clear to auscultation bilaterally.  Normal effort Cardio: S1-S2 is normal regular.  No S3-S4.  No rubs murmurs or bruit GI: Abdomen is soft.  Nontender nondistended.  Bowel sounds are present normal.  No masses organomegaly    DISPOSITION: Home  Discharge Instructions     Call MD for:  difficulty breathing, headache or visual disturbances   Complete by: As directed    Call MD for:  extreme fatigue   Complete by: As directed    Call MD for:  persistant dizziness or light-headedness   Complete by: As directed    Call MD for:  persistant nausea and vomiting   Complete by: As directed    Call MD for:  severe uncontrolled pain   Complete by: As directed    Call MD for:  temperature >100.4   Complete by: As directed    Diet - low sodium heart healthy   Complete by: As directed    Discharge instructions    Complete by: As directed    Please take your medications as prescribed.  Please call the urologist office on Monday to schedule follow-up appointment.  Tell them that you were in the hospital and seen by Dr. Abner Greenspan and underwent ureteral stent placement.  You were cared for by a hospitalist during your hospital stay. If you have any questions about your discharge medications or the care you received while you were in the hospital after you are discharged, you can call the unit and asked to speak with the hospitalist on call if the hospitalist that took care of you is not available. Once you are discharged, your primary care physician will handle any further medical issues. Please note that NO REFILLS for any discharge medications will be authorized once you are discharged, as it is imperative that you return to your primary care physician (or establish a relationship with a  primary care physician if you do not have one) for your aftercare needs so that they can reassess your need for medications and monitor your lab values. If you do not have a primary care physician, you can call [phone] for a physician referral.   Increase activity slowly   Complete by: As directed           Allergies as of 09/03/2021   No Known Allergies      Medication List     TAKE these medications    amLODipine 10 MG tablet Commonly known as: NORVASC Take 10 mg by mouth daily.   levofloxacin 750 MG tablet Commonly known as: LEVAQUIN Take 1 tablet (750 mg total) by mouth daily for 7 days.   losartan-hydrochlorothiazide 100-25 MG tablet Commonly known as: HYZAAR Take 1 tablet by mouth daily.   metoprolol succinate 100 MG 24 hr tablet Commonly known as: TOPROL-XL Take 100 mg by mouth daily.   pravastatin 40 MG tablet Commonly known as: PRAVACHOL Take 40 mg by mouth daily.   saccharomyces boulardii 250 MG capsule Commonly known as: FLORASTOR Take 1 capsule (250 mg total) by mouth 2 (two) times daily for  14 days.   spironolactone 25 MG tablet Commonly known as: ALDACTONE Take 25 mg by mouth daily.          Follow-up Information     [facility]. Schedule an appointment as soon as possible for a visit.   Contact information: [address]        Charolette Forward, MD. Schedule an appointment as soon as possible for a visit in 1 week(s).   Specialty: Cardiology Contact information: [place] [address] [place] [address] [phone]                 TOTAL DISCHARGE TIME: 69 minutes  [facility] Pager on [email or web address]  09/04/2021, 12:20 PM

## 2021-09-20 ENCOUNTER — Other Ambulatory Visit: Payer: Self-pay | Admitting: Urology

## 2021-09-22 ENCOUNTER — Encounter (HOSPITAL_BASED_OUTPATIENT_CLINIC_OR_DEPARTMENT_OTHER): Payer: Self-pay | Admitting: Urology

## 2021-09-27 ENCOUNTER — Other Ambulatory Visit: Payer: Self-pay

## 2021-09-27 ENCOUNTER — Encounter (HOSPITAL_BASED_OUTPATIENT_CLINIC_OR_DEPARTMENT_OTHER): Payer: Self-pay | Admitting: Urology

## 2021-09-27 NOTE — Progress Notes (Signed)
Spoke w/ via phone for pre-op interview--- pt Lab needs dos----  [employer] results------ current ekg in epic/ chart COVID test -----patient states asymptomatic no test needed Arrive at ------- 1115 on 09-30-2021 NPO after MN NO Solid Food.  Clear liquids from MN until--- 1015 Med rec completed Medications to take morning of surgery ----- toprol, norvasc, pravastatiin Diabetic medication ----- n/a Patient instructed no nail polish to be worn day of surgery Patient instructed to bring photo id and insurance card day of surgery Patient aware to have Driver (ride ) / caregiver for 24 hours after surgery --friend, Sherry Arroyo Patient Special Instructions ----- n/a Pre-Op special Istructions ----- n/a Patient verbalized understanding of instructions that were given at this phone interview. Patient denies shortness of breath, chest pain, fever, cough at this phone interview.

## 2021-09-29 NOTE — Patient Outreach (Signed)
Patient to come in at 845

## 2021-09-30 ENCOUNTER — Encounter (HOSPITAL_BASED_OUTPATIENT_CLINIC_OR_DEPARTMENT_OTHER)
Admission: RE | Disposition: A | Discharge status: Home / Self Care | Payer: Self-pay | Source: Home / Self Care | Attending: Urology

## 2021-09-30 ENCOUNTER — Encounter (HOSPITAL_BASED_OUTPATIENT_CLINIC_OR_DEPARTMENT_OTHER): Payer: Self-pay | Admitting: Urology

## 2021-09-30 ENCOUNTER — Ambulatory Visit (HOSPITAL_BASED_OUTPATIENT_CLINIC_OR_DEPARTMENT_OTHER)
Admission: RE | Admit: 2021-09-30 | Discharge: 2021-09-30 | Disposition: A | Discharge status: Home / Self Care | Payer: Medicare Other | Attending: Urology | Admitting: Urology

## 2021-09-30 ENCOUNTER — Ambulatory Visit (HOSPITAL_BASED_OUTPATIENT_CLINIC_OR_DEPARTMENT_OTHER): Payer: Medicare Other | Admitting: Anesthesiology

## 2021-09-30 ENCOUNTER — Other Ambulatory Visit: Payer: Self-pay

## 2021-09-30 DIAGNOSIS — N182 Chronic kidney disease, stage 2 (mild): Secondary | ICD-10-CM | POA: Insufficient documentation

## 2021-09-30 DIAGNOSIS — I129 Hypertensive chronic kidney disease with stage 1 through stage 4 chronic kidney disease, or unspecified chronic kidney disease: Secondary | ICD-10-CM | POA: Insufficient documentation

## 2021-09-30 DIAGNOSIS — E210 Primary hyperparathyroidism: Secondary | ICD-10-CM | POA: Diagnosis not present

## 2021-09-30 DIAGNOSIS — N202 Calculus of kidney with calculus of ureter: Secondary | ICD-10-CM | POA: Insufficient documentation

## 2021-09-30 DIAGNOSIS — F17210 Nicotine dependence, cigarettes, uncomplicated: Secondary | ICD-10-CM | POA: Diagnosis not present

## 2021-09-30 HISTORY — DX: Personal history of other benign neoplasm: Z86.018

## 2021-09-30 HISTORY — DX: Frequency of micturition: R35.0

## 2021-09-30 HISTORY — DX: Calculus of ureter: N20.1

## 2021-09-30 HISTORY — DX: Presence of spectacles and contact lenses: Z97.3

## 2021-09-30 HISTORY — DX: Calculus of kidney: N20.0

## 2021-09-30 HISTORY — DX: Nontoxic single thyroid nodule: E04.1

## 2021-09-30 HISTORY — PX: CYSTOSCOPY/URETEROSCOPY/HOLMIUM LASER/STENT PLACEMENT: SHX6546

## 2021-09-30 HISTORY — DX: Gastro-esophageal reflux disease without esophagitis: K21.9

## 2021-09-30 HISTORY — DX: Complete loss of teeth, unspecified cause, unspecified class: K08.109

## 2021-09-30 LAB — POCT I-STAT, CHEM 8
BUN: 17 mg/dL (ref 8–23)
Calcium, Ion: 1.41 mmol/L — ABNORMAL HIGH (ref 1.15–1.40)
Chloride: 105 mmol/L (ref 98–111)
Creatinine, Ser: 1 mg/dL (ref 0.44–1.00)
Glucose, Bld: 103 mg/dL — ABNORMAL HIGH (ref 70–99)
HCT: 42 % (ref 36.0–46.0)
Hemoglobin: 14.3 g/dL (ref 12.0–15.0)
Potassium: 3.8 mmol/L (ref 3.5–5.1)
Sodium: 143 mmol/L (ref 135–145)
TCO2: 27 mmol/L (ref 22–32)

## 2021-09-30 SURGERY — CYSTOSCOPY/URETEROSCOPY/HOLMIUM LASER/STENT PLACEMENT
Anesthesia: General | Site: Renal | Laterality: Right

## 2021-09-30 MED ORDER — LIDOCAINE 2% (20 MG/ML) 5 ML SYRINGE
INTRAMUSCULAR | Status: AC
  Filled 2021-09-30: qty 5

## 2021-09-30 MED ORDER — ROCURONIUM BROMIDE 10 MG/ML (PF) SYRINGE
PREFILLED_SYRINGE | INTRAVENOUS | Status: AC
  Filled 2021-09-30: qty 10

## 2021-09-30 MED ORDER — FENTANYL CITRATE (PF) 100 MCG/2ML IJ SOLN
INTRAMUSCULAR | Status: AC
  Filled 2021-09-30: qty 2

## 2021-09-30 MED ORDER — FENTANYL CITRATE (PF) 100 MCG/2ML IJ SOLN
INTRAMUSCULAR | Status: DC | PRN
  Administered 2021-09-30 (×4): 50 ug via INTRAVENOUS

## 2021-09-30 MED ORDER — ACETAMINOPHEN 500 MG PO TABS
ORAL_TABLET | ORAL | Status: AC
  Filled 2021-09-30: qty 2

## 2021-09-30 MED ORDER — LABETALOL HCL 5 MG/ML IV SOLN
INTRAVENOUS | Status: AC
  Filled 2021-09-30: qty 4

## 2021-09-30 MED ORDER — ACETAMINOPHEN 500 MG PO TABS
1000.0000 mg | ORAL_TABLET | Freq: Once | ORAL | Status: AC
  Administered 2021-09-30: 1000 mg via ORAL

## 2021-09-30 MED ORDER — LACTATED RINGERS IV SOLN: INTRAVENOUS | Status: DC

## 2021-09-30 MED ORDER — FENTANYL CITRATE (PF) 100 MCG/2ML IJ SOLN: 25.0000 ug | INTRAMUSCULAR | Status: DC | PRN

## 2021-09-30 MED ORDER — LIDOCAINE 2% (20 MG/ML) 5 ML SYRINGE
INTRAMUSCULAR | Status: DC | PRN
  Administered 2021-09-30 (×2): 60 mg via INTRAVENOUS

## 2021-09-30 MED ORDER — HYDRALAZINE HCL 20 MG/ML IJ SOLN
INTRAMUSCULAR | Status: AC
  Filled 2021-09-30: qty 1

## 2021-09-30 MED ORDER — PROPOFOL 10 MG/ML IV BOLUS
INTRAVENOUS | Status: DC | PRN
  Administered 2021-09-30: 200 mg via INTRAVENOUS

## 2021-09-30 MED ORDER — DOCUSATE SODIUM 100 MG PO CAPS: 100.0000 mg | ORAL_CAPSULE | Freq: Every day | ORAL | 0 refills | Status: AC | PRN

## 2021-09-30 MED ORDER — LABETALOL HCL 5 MG/ML IV SOLN
INTRAVENOUS | Status: DC | PRN
  Administered 2021-09-30 (×4): 5 mg via INTRAVENOUS

## 2021-09-30 MED ORDER — ROCURONIUM BROMIDE 10 MG/ML (PF) SYRINGE
PREFILLED_SYRINGE | INTRAVENOUS | Status: DC | PRN
  Administered 2021-09-30: 40 mg via INTRAVENOUS

## 2021-09-30 MED ORDER — CEFAZOLIN SODIUM-DEXTROSE 2-4 GM/100ML-% IV SOLN
2.0000 g | Freq: Once | INTRAVENOUS | Status: AC
  Administered 2021-09-30: 2 g via INTRAVENOUS

## 2021-09-30 MED ORDER — MIDAZOLAM HCL 2 MG/2ML IJ SOLN
INTRAMUSCULAR | Status: AC
  Filled 2021-09-30: qty 2

## 2021-09-30 MED ORDER — IOHEXOL 300 MG/ML  SOLN
INTRAMUSCULAR | Status: DC | PRN
  Administered 2021-09-30: 5 mL via URETHRAL

## 2021-09-30 MED ORDER — AMISULPRIDE (ANTIEMETIC) 5 MG/2ML IV SOLN: 10.0000 mg | Freq: Once | INTRAVENOUS | Status: DC | PRN

## 2021-09-30 MED ORDER — HYDRALAZINE HCL 20 MG/ML IJ SOLN
INTRAMUSCULAR | Status: DC | PRN
  Administered 2021-09-30 (×4): 5 mg via INTRAVENOUS

## 2021-09-30 MED ORDER — SODIUM CHLORIDE 0.9 % IR SOLN
Status: DC | PRN
  Administered 2021-09-30: 1800 mL

## 2021-09-30 MED ORDER — OXYCODONE-ACETAMINOPHEN 5-325 MG PO TABS: 1.0000 | ORAL_TABLET | ORAL | 0 refills | Status: AC | PRN

## 2021-09-30 MED ORDER — MIDAZOLAM HCL 2 MG/2ML IJ SOLN
INTRAMUSCULAR | Status: DC | PRN
  Administered 2021-09-30: 2 mg via INTRAVENOUS

## 2021-09-30 MED ORDER — CEPHALEXIN 500 MG PO CAPS: 500.0000 mg | ORAL_CAPSULE | Freq: Two times a day (BID) | ORAL | 0 refills | Status: AC

## 2021-09-30 MED ORDER — CEFAZOLIN SODIUM-DEXTROSE 2-4 GM/100ML-% IV SOLN
INTRAVENOUS | Status: AC
  Filled 2021-09-30: qty 100

## 2021-09-30 MED ORDER — SUGAMMADEX SODIUM 200 MG/2ML IV SOLN
INTRAVENOUS | Status: DC | PRN
  Administered 2021-09-30: 200 mg via INTRAVENOUS

## 2021-09-30 SURGICAL SUPPLY — 26 items
BAG DRAIN URO-CYSTO SKYTR STRL (DRAIN) ×2 IMPLANT
BAG DRN UROCATH (DRAIN) ×1
BASKET ZERO TIP NITINOL 2.4FR (BASKET) ×1 IMPLANT
BSKT STON RTRVL ZERO TP 2.4FR (BASKET) ×1
CATH SET URETHRAL DILATOR (CATHETERS) IMPLANT
CATH URET 5FR 28IN OPEN ENDED (CATHETERS) ×2 IMPLANT
CLOTH BEACON ORANGE TIMEOUT ST (SAFETY) ×2 IMPLANT
FIBER LASER FLEXIVA 365 (UROLOGICAL SUPPLIES) IMPLANT
GLOVE SURG ENC MOIS LTX SZ7 (GLOVE) ×2 IMPLANT
GOWN STRL REUS W/TWL LRG LVL3 (GOWN DISPOSABLE) ×2 IMPLANT
GUIDEWIRE STR DUAL SENSOR (WIRE) ×3 IMPLANT
GUIDEWIRE ZIPWRE .038 STRAIGHT (WIRE) ×1 IMPLANT
IV NS 1000ML (IV SOLUTION)
IV NS 1000ML BAXH (IV SOLUTION) ×1 IMPLANT
IV NS IRRIG 3000ML ARTHROMATIC (IV SOLUTION) ×2 IMPLANT
KIT TURNOVER CYSTO (KITS) ×2 IMPLANT
MANIFOLD NEPTUNE II (INSTRUMENTS) ×2 IMPLANT
NS IRRIG 500ML POUR BTL (IV SOLUTION) ×2 IMPLANT
PACK CYSTO (CUSTOM PROCEDURE TRAY) ×2 IMPLANT
SHEATH URETERAL 12FRX35CM (MISCELLANEOUS) ×1 IMPLANT
SYR 10ML LL (SYRINGE) ×2 IMPLANT
TRACTIP FLEXIVA PULS ID 200XHI (Laser) IMPLANT
TRACTIP FLEXIVA PULSE ID 200 (Laser)
TUBE CONNECTING 12X1/4 (SUCTIONS) ×2 IMPLANT
TUBE FEEDING 8FR 16IN STR KANG (MISCELLANEOUS) IMPLANT
TUBING UROLOGY SET (TUBING) ×2 IMPLANT

## 2021-09-30 NOTE — Transfer of Care (Signed)
Immediate Anesthesia Transfer of Care Note  Patient: Sherry Arroyo  Procedure(s) Performed: Procedure(s) (LRB): CYSTOSCOPY/RETROGRADE/URETEROSCOPY/HOLMIUM LASER/STENT EXCHANGE (Right)  Patient Location: PACU  Anesthesia Type: General  Level of Consciousness: awake, oriented, sedated and patient cooperative  Airway & Oxygen Therapy: Patient Spontanous Breathing and Patient connected to face mask oxygen  Post-op Assessment: Report given to PACU RN and Post -op Vital signs reviewed and stable  Post vital signs: Reviewed and stable  Complications: No apparent anesthesia complications  Last Vitals:  Vitals Value Taken Time  BP 134/88 09/30/21 1417  Temp    Pulse 83 09/30/21 1419  Resp 18 09/30/21 1419  SpO2 94 % 09/30/21 1419  Vitals shown include unvalidated device data.  Last Pain:  Vitals:   09/30/21 0915  TempSrc: Oral  PainSc: 0-No pain      Patients Stated Pain Goal: 5 (15/61/53 7943)  Complications: No notable events documented.

## 2021-09-30 NOTE — Anesthesia Preprocedure Evaluation (Signed)
Anesthesia Evaluation  Patient identified by MRN, date of birth, ID band Patient awake    Reviewed: Allergy & Precautions, NPO status , Patient's Chart, lab work & pertinent test results  Airway Mallampati: II  TM Distance: >3 FB Neck ROM: Full    Dental   Pulmonary Current Smoker and Patient abstained from smoking.,    breath sounds clear to auscultation       Cardiovascular hypertension, Pt. on medications and Pt. on home beta blockers  Rhythm:Regular Rate:Normal     Neuro/Psych negative neurological ROS     GI/Hepatic Neg liver ROS, GERD  ,  Endo/Other  negative endocrine ROS  Renal/GU CRFRenal disease     Musculoskeletal   Abdominal   Peds  Hematology negative hematology ROS (+)   Anesthesia Other Findings   Reproductive/Obstetrics                             Anesthesia Physical Anesthesia Plan  ASA: 3  Anesthesia Plan: General   Post-op Pain Management:    Induction: Intravenous  PONV Risk Score and Plan: 2 and Dexamethasone, Ondansetron and Treatment may vary due to age or medical condition  Airway Management Planned: LMA  Additional Equipment: None  Intra-op Plan:   Post-operative Plan: Extubation in OR  Informed Consent: I have reviewed the patients History and Physical, chart, labs and discussed the procedure including the risks, benefits and alternatives for the proposed anesthesia with the patient or authorized representative who has indicated his/her understanding and acceptance.     Dental advisory given  Plan Discussed with: CRNA  Anesthesia Plan Comments:         Anesthesia Quick Evaluation

## 2021-09-30 NOTE — H&P (Signed)
CC/HPI: Sherry Arroyo is 65 year old female seen in consultation with a right ureteral stone.   1. Right ureteral stone: She presented to the ED on 09/01/2021 with right lower quadrant abdominal pain, afebrile, no dysuria. White blood cell count was 13.6. She has sign of infection although urinalysis demonstrated no nitrites. CT A/P 09/01/2021 demonstrated 5 mm proximal right ureteral stone with right hydronephrosis and perinephric stranding. She also had a right lower pole stones which were small. She is scheduled for right ureteroscopy with laser lithotripsy and basket extraction of stone on 09/30/2021. She denies fevers or chills. She denies dysuria. She was tolerating her stent well.  This is her first stone episode.   She also has a history of CKD stage II, obesity, hypertension, primary hyperparathyroidism.     ALLERGIES: No Known Drug Allergies    MEDICATIONS: Metoprolol Tartrate  Amlodipine Besylate  Losartan Potassium  Pravastatin Sodium  Spironolactone     GU PSH: No GU PSH    NON-GU PSH: No Non-GU PSH    GU PMH: None   NON-GU PMH: Hypertension    FAMILY HISTORY: High Blood Pressure - Runs in Family   SOCIAL HISTORY: Marital Status: Single Ethnicity: Not Hispanic Or Latino; Race: Black or African American Does drink.  Does not drink caffeine.    REVIEW OF SYSTEMS:    GU Review Female:   Patient reports get up at night to urinate. Patient denies frequent urination, hard to postpone urination, burning /pain with urination, leakage of urine, stream starts and stops, trouble starting your stream, have to strain to urinate, and being pregnant.  Gastrointestinal (Upper):   Patient denies nausea, vomiting, and indigestion/ heartburn.  Gastrointestinal (Lower):   Patient denies diarrhea and constipation.  Constitutional:   Patient denies fever, night sweats, weight loss, and fatigue.  Skin:   Patient denies skin rash/ lesion and itching.  Eyes:   Patient denies blurred  vision and double vision.  Ears/ Nose/ Throat:   Patient denies sore throat and sinus problems.  Hematologic/Lymphatic:   Patient denies easy bruising and swollen glands.  Cardiovascular:   Patient denies leg swelling and chest pains.  Respiratory:   Patient reports shortness of breath. Patient denies cough.  Endocrine:   Patient denies excessive thirst.  Musculoskeletal:   Patient reports back pain. Patient denies joint pain.  Neurological:   Patient denies headaches and dizziness.  Psychologic:   Patient denies depression and anxiety.   VITAL SIGNS:      09/22/2021 10:04 AM  Height 65 in / 165.1 cm  BP 144/99 mmHg  Pulse 78 /min  Temperature 97.1 F / 36.1 C   MULTI-SYSTEM PHYSICAL EXAMINATION:    Constitutional: Well-nourished. No physical deformities. Normally developed. Good grooming.  Respiratory: No labored breathing, no use of accessory muscles.   Cardiovascular: Normal temperature, normal extremity pulses, no swelling, no varicosities.  Gastrointestinal: No mass, no tenderness, no rigidity, obese     Complexity of Data:  Source Of History:  Patient, Medical Record Summary  Urine Test Review:   Urinalysis  X-Ray Review: C.T. Abdomen/Pelvis: Reviewed Films. Reviewed Report. Discussed With Patient.    Notes:                     CLINICAL DATA: Right lower quadrant pain, flank pain   EXAM:  CT ABDOMEN AND PELVIS WITHOUT CONTRAST   TECHNIQUE:  Multidetector CT imaging of the abdomen and pelvis was performed  following the standard protocol without IV contrast.  COMPARISON: None.   FINDINGS:  Lower chest: No acute abnormality.   Hepatobiliary: No focal hepatic abnormality. Gallbladder  unremarkable.   Pancreas: No focal abnormality or ductal dilatation.   Spleen: No focal abnormality. Normal size.   Adrenals/Urinary Tract: Moderate right hydronephrosis and  perinephric stranding due to 5 mm proximal right ureteral stone.  Small nonobstructing stone in the lower  pole of the right kidney. No  stones or hydronephrosis on the left. Urinary bladder and adrenal  glands unremarkable.   Stomach/Bowel: Stomach, large and small bowel grossly unremarkable.  Normal appendix.   Vascular/Lymphatic: Calcified aorta and iliac vessels. No evidence  of aneurysm or adenopathy.   Reproductive: Prior hysterectomy. No adnexal masses.   Other: No free fluid or free air.   Musculoskeletal: No acute bony abnormality.   IMPRESSION:  5 mm proximal right ureteral stone with right hydronephrosis and  perinephric stranding.   Right lower pole nephrolithiasis.   Aortoiliac atherosclerosis.    Electronically Signed  By: Rolm Baptise M.D.  On: 09/01/2021 09:34   PROCEDURES:          Urinalysis w/Scope - 81001 Dipstick Dipstick Cont'd Micro  Color: Amber Bilirubin: Neg WBC/hpf: 6 - 10/hpf  Appearance: Cloudy Ketones: Neg RBC/hpf: >60/hpf  Specific Gravity: 1.025 Blood: 3+ Bacteria: Few (10-25/hpf)  pH: 5.5 Protein: 2+ Cystals: NS (Not Seen)  Glucose: Neg Urobilinogen: 0.2 Casts: NS (Not Seen)    Nitrites: Neg Trichomonas: Present    Leukocyte Esterase: 2+ Mucous: Not Present      Epithelial Cells: NS (Not Seen)      Yeast: NS (Not Seen)      Sperm: Not Present    Notes:      ASSESSMENT:      ICD-10 Details  1 GU:   Ureteral calculus - N20.1    PLAN:           Orders Labs CULTURE, URINE          Document Letter(s):  Created for Patient: Clinical Summary         Notes:    #1. Right ureteral stone: She is scheduled for right ureteroscopy with laser lithotripsy and basket extraction of stone on 09/30/2021. We will send preoperative urine for culture today. Discussed risk and benefits as below. Discussed on prevention as below. Discussed anticipated postoperative course with renal ultrasound 1 month following.   We discussed dietary methods for stone prevention including the following: increased water intake to 2-3 liters per day, add lemon or  lemon concentrate to water to increase citrate which is beneficial for stone prevention, limiting dietary sodium to less than 2000 mg per day, limiting animal protein to less than 2 servings (16 ounces/day), and limiting foods high in oxalate content (spinach, beans, chocolate, etc.).   We discussed the options for management of kidney stones, including observation, ESWL, ureteroscopy with laser lithotripsy, and PCNL. The risks and benefits of each option were discussed.  For observation I described the risks which include but are not limited to silent renal damage, life-threatening infection, need for emergent surgery, failure to pass stone, and pain.   ESWL: risks and benefits of ESWL were outlined including infection, bleeding, pain, steinstrasse, kidney injury, need for ancillary treatments, and global anesthesia risks including but not limited to CVA, MI, DVT, PE, pneumonia, and death.   Ureteroscopy: risks and benefits of ureteroscopy were outlined, including infection, bleeding, pain, temporary ureteral stent and associated stent bother, ureteral injury, ureteral stricture, need for ancillary treatments, and global  anesthesia risks including but not limited to CVA, MI, DVT, PE, pneumonia, and death.   PCNL: risks and benefits of PCNL were outlined including infection, bleeding, blood transfusion, pain, pneumothorax, bowel injury, persistent urine leak, positioning injury, inability to clear stone burden, renal laceration, arterial venous fistula or malformation, need for ancillary treatments, and global anesthesia risks including but not limited to CVA, MI, DVT, PE, pneumonia, and death.          Next Appointment:      Next Appointment: 09/30/2021 01:15 PM    Appointment Type: Surgery     Location: [facility] [phone]    Provider: Rexene Alberts, M.D.    Reason for Visit: NE/OP CYSTO, RT RPG, RT URS, LL, RT STENT    Urology Preoperative H&P   Chief Complaint: Right  ureteral stone  History of Present Illness: Sherry Arroyo is a 65 y.o. female with right ureteral stone here for cysto, R RPG, R URS/LL, R stent.    Past Medical History:  Diagnosis Date   Chronic kidney disease (CKD), stage II (mild)    pt denies at preop   Class 1 obesity due to excess calories with body mass index (BMI) of 34.0 to 34.9 in adult 09/01/2021   Dyspnea    09-27-2021  per pt "when I do to much"  like long distance walking, stairs, but does ok with houshold chores   Frequency of urination    Full dentures    GERD (gastroesophageal reflux disease)    History of uterine fibroid    Hypercalcemia    Hypertension    followed by pcp   Nephrolithiasis    per CT 09-01-2021 nonobstructive right side   Primary hyperparathyroidism ([place])    followed by pcp---  previously seen by endocrinologist-- dr Harriett Rush in epic 03-21-2019   Right ureteral calculus    Thyroid nodule    last ultrasound in epic 10-18-2018, no bx did not meet criteria   Vitamin D deficiency    Wears glasses     Past Surgical History:  Procedure Laterality Date   CYSTOSCOPY W/ URETERAL STENT PLACEMENT Right 09/01/2021   Procedure: CYSTOSCOPY WITH RETROGRADE PYELOGRAM/URETERAL STENT PLACEMENT;  Surgeon: Janith Lima, MD;  Location: [facility];  Service: Urology;  Laterality: Right;   LAPAROSCOPIC ASSISTED VAGINAL HYSTERECTOMY  09-25-2002    dr Charlesetta Garibaldi  @[facility]    W/  BILATERAL SALPINGO-OOPHORECTOMY   PARATHYROIDECTOMY Right 12/05/2018   Procedure: RIGHT SUPERIOR PARATHYROIDECTOMY;  Surgeon: Armandina Gemma, MD;  Location: [facility];  Service: General;  Laterality: Right;   PARATHYROIDECTOMY Left 11/03/2005   @[facility]  by dr Debbora Presto;  left upper gland  (adenoma)    Allergies: No Known Allergies  History reviewed. No pertinent family history.  Social History:  reports that she has been smoking cigarettes. She has a 50.00 pack-year smoking history. She has never used smokeless tobacco. She reports current alcohol  use of about 14.0 standard drinks per week. She reports that she does not currently use drugs after having used the following drugs: Cocaine.  ROS: A complete review of systems was performed.  All systems are negative except for pertinent findings as noted.  Physical Exam:  Vital signs in last 24 hours: Temp:  [97.6 F (36.4 C)] 97.6 F (36.4 C) (10/28 0915) Pulse Rate:  [73] 73 (10/28 0915) Resp:  [16] 16 (10/28 0915) BP: (176)/(97) 176/97 (10/28 0915) SpO2:  [98 %] 98 % (10/28 0915) Weight:  [93.9 kg] 93.9 kg (  10/28 0915) Constitutional:  Alert and oriented, No acute distress Cardiovascular: Regular rate and rhythm Respiratory: Normal respiratory effort, Lungs clear bilaterally GI: Abdomen is soft, nontender, nondistended, no abdominal masses GU: No CVA tenderness Lymphatic: No lymphadenopathy Neurologic: Grossly intact, no focal deficits Psychiatric: Normal mood and affect  Laboratory Data:  Recent Labs    09/30/21 0910  HGB 14.3  HCT 42.0    Recent Labs    09/30/21 0910  NA 143  K 3.8  CL 105  GLUCOSE 103*  BUN 17  CREATININE 1.00     Results for orders placed or performed during the hospital encounter of 09/30/21 (from the past 24 hour(s))  I-STAT, chem 8     Status: Abnormal   Collection Time: 09/30/21  9:10 AM  Result Value Ref Range   Sodium 143 135 - 145 mmol/L   Potassium 3.8 3.5 - 5.1 mmol/L   Chloride 105 98 - 111 mmol/L   BUN 17 8 - 23 mg/dL   Creatinine, Ser 1.00 0.44 - 1.00 mg/dL   Glucose, Bld 103 (H) 70 - 99 mg/dL   Calcium, Ion 1.41 (H) 1.15 - 1.40 mmol/L   TCO2 27 22 - 32 mmol/L   Hemoglobin 14.3 12.0 - 15.0 g/dL   HCT 42.0 36.0 - 46.0 %   No results found for this or any previous visit (from the past 240 hour(s)).  Renal Function: Recent Labs    09/30/21 0910  CREATININE 1.00   Estimated Creatinine Clearance: 63.6 mL/min (by C-G formula based on SCr of 1 mg/dL).  Radiologic Imaging: No results found.  I independently reviewed  the above imaging studies.  Assessment and Plan Sherry Arroyo is a 65 y.o. female with right ureteral stone here for cysto, R RPG, R URS/LL, R stent.  -The risks, benefits and alternatives of cysto, R RPG, R URS/LL, R stent was discussed with the patient.  Risks include, but are not limited to: bleeding, urinary tract infection, ureteral injury, ureteral stricture disease, chronic pain, urinary symptoms, bladder injury, stent migration, the need for nephrostomy tube placement, MI, CVA, DVT, PE and the inherent risks with general anesthesia.  The patient voices understanding and wishes to proceed.       Matt R. Jossilyn Benda MD 09/30/2021, 12:23 PM  [facility] Pager: [phone]): [phone]

## 2021-09-30 NOTE — OR Nursing (Signed)
Ureteral stent removed from right ureter in [facility] OR #2 by Dr. Abner Greenspan.

## 2021-09-30 NOTE — Op Note (Signed)
Operative Note  Preoperative diagnosis:  1.  Right ureteral and renal stones  Postoperative diagnosis: 1.  Right ureteral and renal stones  Procedure(s): 1.  Cystoscopy 2. Right ureteroscopy with laser lithotripsy and basket extraction of stones 3. Right retrograde pyelogram 4. Right ureteral stent exchange 5. Fluoroscopy with intraoperative interpretation  Surgeon: Rexene Alberts, MD  Assistants:  None  Anesthesia:  General  Complications:  None  EBL:  Minimal  Specimens: 1. Stones for stone analysis (to be done at [facility])  Drains/Catheters: 1.  6Fr x 26cm ureteral stent with a tether string  Intraoperative findings:   Cystoscopy demonstrated no suspicious bladder lesions with previous stent in appropriate position with minimal encrustation. Right ureteroscopy demonstrated 2 separate proximal right ureteral stones which were fragmented and removed. Right pyeloscopy demonstrated 4 separate large right lower pole stones which were fragmented and removed. She did have a slight narrow caliber of her proximal right ureter. There was no ureteral trauma. Excellent hemostasis. Successful right stent exchange.  Indication:  Sherry Arroyo is a 65 y.o. female with a history of right ureteral stones here for right ureteroscopy with laser lithotripsy and basket extraction of stones.  Description of procedure: After informed consent was obtained from the patient, the patient was identified and taken to the operating room and placed in the supine position.  General anesthesia was administered as well as perioperative IV antibiotics.  At the beginning of the case, a time-out was performed to properly identify the patient, the surgery to be performed, and the surgical site.  Sequential compression devices were applied to the lower extremities at the beginning of the case for DVT prophylaxis.  The patient was then placed in the dorsal lithotomy supine position, prepped and draped  in sterile fashion.  We then passed the 21-French rigid cystoscope through the urethra and into the bladder under vision without any difficulty, noting a normal urethra. A systematic evaluation of the bladder revealed no evidence of any suspicious bladder lesions.  Ureteral orifices were in normal position.    The distal aspect of the ureteral stent was seen protruding from the right ureteral orifice.  We then used the alligator-tooth forceps and grasped the distal end of the ureteral stent and brought it out the urethral meatus while watching the proximal coil straighten out nicely on fluoroscopy. Through the ureteral stent, we then passed a 0.038 sensor wire up to the level of the renal pelvis.  The ureteral stent was then removed, leaving the sensor wire up the right ureter.    Under cystoscopic and flouroscopic guidance, we cannulated the right ureteral orifice with a 5-French open-ended ureteral catheter and a gentle retrograde pyelogram was performed, revealing a normal caliber ureter without any filling defects. There was no hydronephrosis of the collecting system. A 0.038 sensor wire was then passed up to the level of the renal pelvis and secured to the drape as a safety wire. The ureteral catheter and cystoscope were removed, leaving the safety wire in place.   A semi-rigid ureteroscope was passed alongside the wire up the distal ureter which appeared normal. I encountered 2 separate proximal right ureteral stones which were fragmented and extracted. A second 0.038 sensor wire was passed under direct vision and the semirigid scope was removed. A 12/14Fr ureteral access sheath was carefully advanced up the ureter to the level of the UPJ over this wire under fluoroscopic guidance. The flexible ureteroscope was advanced into the collecting system via the access sheath. The collecting system  was inspected. The stones were identified at the right lower pole. Using the 200 micron holmium laser fiber, the  stone was fragmented completely. A 2.2 Fr zero tip basket was used to remove the fragments under visual guidance. These were sent for chemical analysis. With the ureteroscope in the kidney, a gentle pyelogram was performed to delineate the calyceal system and we evaluated the calyces systematically. We encountered a no further stones. The rest of the stone fragments were very tiny and these were  irrigated away gently. The calyces were re-inspected and there were no significant stone fragment residual.   We then withdrew the ureteroscope back down the ureter along with the access sheath, noting no evidence of any stones along the course of the ureter.  Prior to removing the ureteroscope, we did pass the Glidewire back up to the ureter to the renal pelvis.  Once the ureteroscope was removed, the Glidewire was backloaded through the rigid cystoscope, which was then advanced down the urethra and into the bladder. We then used the Glidewire under direct vision through the rigid cystoscope and under fluoroscopic guidance and passed up a 6-French, 26 cm double-pigtail ureteral stent up ureter, making sure that the proximal and distal ends coiled within the kidney and bladder respectively.  Note that we left a long tether string attached to the distal end of the ureteral stent and it exited the urethral meatus and was placed into the vagina.  The cystoscope was then advanced back into the bladder under vision.  We were able to see the distal stent coiling nicely within the bladder.  The bladder was then emptied with irrigation solution.  The cystoscope was then removed.    The patient tolerated the procedure well and there was no complication. Patient was awoken from anesthesia and taken to the recovery room in stable condition. I was present and scrubbed for the entirety of the case.  Plan:  Patient will be discharged home. She will remove her stent at home on Monday AM.   Matt R. [facility]   Pager: [phone]

## 2021-09-30 NOTE — Anesthesia Procedure Notes (Signed)
Procedure Name: Intubation Date/Time: 09/30/2021 1:48 PM Performed by: Suan Halter, CRNA Pre-anesthesia Checklist: Patient identified, Emergency Drugs available, Suction available and Patient being monitored Patient Re-evaluated:Patient Re-evaluated prior to induction Oxygen Delivery Method: Circle system utilized Preoxygenation: Pre-oxygenation with 100% oxygen Induction Type: IV induction Ventilation: Mask ventilation without difficulty Laryngoscope Size: Mac and 4 Grade View: Grade I Tube type: Oral Tube size: 7.0 mm Number of attempts: 1 Airway Equipment and Method: Stylet and Oral airway Placement Confirmation: ETT inserted through vocal cords under direct vision, positive ETCO2 and breath sounds checked- equal and bilateral Secured at: 22 cm Tube secured with: Tape Dental Injury: Teeth and Oropharynx as per pre-operative assessment

## 2021-09-30 NOTE — Discharge Instructions (Addendum)
[facility] [phone] Post Ureteroscopy With or Without Stent Instructions  Definitions:  Ureter: The duct that transports urine from the kidney to the bladder. Stent:   A plastic hollow tube that is placed into the ureter, from the kidney to the bladder to prevent the ureter from swelling shut.  GENERAL INSTRUCTIONS:  Despite the fact that no skin incisions were used, the area around the ureter and bladder is raw and irritated. The stent is a foreign body which will further irritate the bladder wall. This irritation is manifested by increased frequency of urination, both day and night, and by an increase in the urge to urinate. In some, the urge to urinate is present almost always. Sometimes the urge is strong enough that you may not be able to stop yourself from urinating. The only real cure is to remove the stent and then give time for the bladder wall to heal which can't be done until the danger of the ureter swelling shut has passed, which varies.  You may see some blood in your urine while the stent is in place and a few days afterwards. Do not be alarmed, even if the urine was clear for a while. Get off your feet and drink lots of fluids until clearing occurs. If you start to pass clots or don't improve, call us.  DIET: You may return to your normal diet immediately. Because of the raw surface of your bladder, alcohol, spicy foods, acid type foods and drinks with caffeine may cause irritation or frequency and should be used in moderation. To keep your urine flowing freely and to avoid constipation, drink plenty of fluids during the day ( 8-10 glasses ). Tip: Avoid cranberry juice because it is very acidic.  ACTIVITY: Your physical activity doesn't need to be restricted. However, if you are very active, you may see some blood in your urine. We suggest that you reduce your activity under these circumstances until the bleeding has stopped.  BOWELS: It is important to  keep your bowels regular during the postoperative period. Straining with bowel movements can cause bleeding. A bowel movement every other day is reasonable. Use a mild laxative if needed, such as Milk of Magnesia 2-3 tablespoons, or 2 Dulcolax tablets. Call if you continue to have problems. If you have been taking narcotics for pain, before, during or after your surgery, you may be constipated. Take a laxative if necessary.   MEDICATION: You should resume your pre-surgery medications unless told not to. In addition you will often be given an antibiotic to prevent infection. These should be taken as prescribed until the bottles are finished unless you are having an unusual reaction to one of the drugs.  PROBLEMS YOU SHOULD REPORT TO [place]: Fevers over 100.5 Fahrenheit. Heavy bleeding, or clots ( See above notes about blood in urine ). Inability to urinate. Drug reactions ( hives, rash, nausea, vomiting, diarrhea ). Severe burning or pain with urination that is not improving.  FOLLOW-UP: You will need a follow-up appointment to monitor your progress. Call for this appointment at the number listed above. Usually the first appointment will be about three to fourteen days after your surgery.  NEXT Dose of Tylenol (acetaminophen) after 3:15 PM today if needed.    Post Anesthesia Home Care Instructions  Activity: Get plenty of rest for the remainder of the day. A responsible individual must stay with you for 24 hours following the procedure.  For the next 24 hours, DO NOT: -Drive a car -Operate  machinery -Drink alcoholic beverages -Take any medication unless instructed by your physician -Make any legal decisions or sign important papers.  Meals: Start with liquid foods such as gelatin or soup. Progress to regular foods as tolerated. Avoid greasy, spicy, heavy foods. If nausea and/or vomiting occur, drink only clear liquids until the nausea and/or vomiting subsides. Call your physician if  vomiting continues.  Special Instructions/Symptoms: Your throat may feel dry or sore from the anesthesia or the breathing tube placed in your throat during surgery. If this causes discomfort, gargle with warm salt water. The discomfort should disappear within 24 hours.   You have a right ureteral stent in place attached to a string and placed into the vagina. Remove stent by pulling string on Monday AM.

## 2021-10-03 ENCOUNTER — Encounter (HOSPITAL_BASED_OUTPATIENT_CLINIC_OR_DEPARTMENT_OTHER): Payer: Self-pay | Admitting: Urology

## 2021-10-03 NOTE — Anesthesia Postprocedure Evaluation (Signed)
Anesthesia Post Note  Patient: Sherry Arroyo  Procedure(s) Performed: CYSTOSCOPY/RETROGRADE/URETEROSCOPY/HOLMIUM LASER/STENT EXCHANGE (Right: Renal)     Patient location during evaluation: PACU Anesthesia Type: General Level of consciousness: awake and alert Pain management: pain level controlled Vital Signs Assessment: post-procedure vital signs reviewed and stable Respiratory status: spontaneous breathing, nonlabored ventilation, respiratory function stable and patient connected to nasal cannula oxygen Cardiovascular status: blood pressure returned to baseline and stable Postop Assessment: no apparent nausea or vomiting Anesthetic complications: no   No notable events documented.  Last Vitals:  Vitals:   09/30/21 1445 09/30/21 1515  BP: 132/75 (!) 144/96  Pulse: 77 78  Resp: (!) 23 (!) 24  Temp:  (!) 36.4 C  SpO2: 94% 94%    Last Pain:  Vitals:   09/30/21 1515  TempSrc:   PainSc: 0-No pain                 Tiajuana Amass

## 2022-09-26 IMAGING — CT CT ABD-PELV W/O CM
2 of 4 series · 17 of 46 positions shown, 19 images · non-contrast
Comparison: None.

CLINICAL DATA: Right lower quadrant pain, flank pain

EXAM:
CT ABDOMEN AND PELVIS WITHOUT CONTRAST
TECHNIQUE: Multidetector CT imaging of the abdomen and pelvis was performed
following the standard protocol without IV contrast.

[Series 3: a/p w/o cor · coronal · non-contrast · 0.82mm/px · 3 of 169 slices shown]
[im 57/169  soft-tissue]
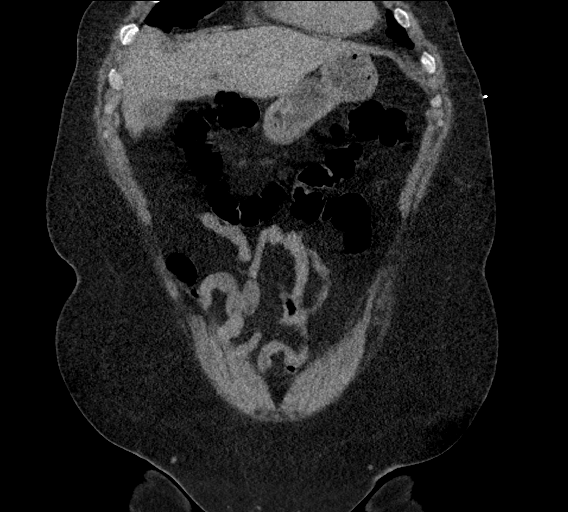
[im 75/169  soft-tissue]
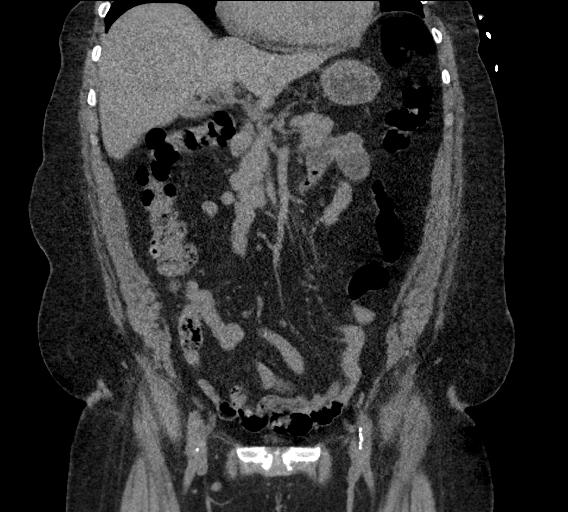
[im 94/169  soft-tissue]
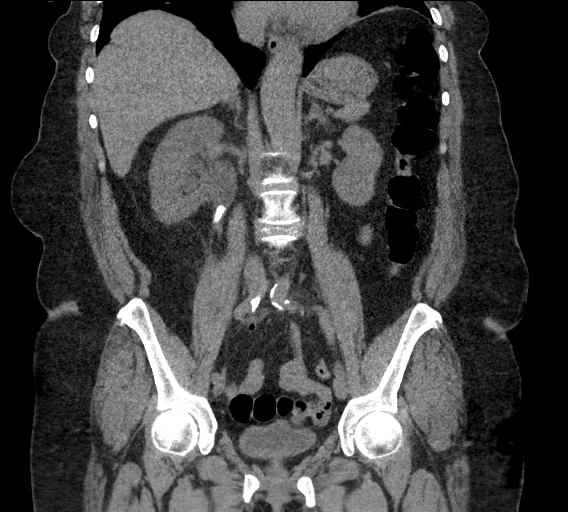

[Series 4: a/p w/o 5mm · axial · non-contrast · 0.96mm/px · z∈[+893,+1258]mm · 14 of 85 slices shown, 16 images]
[im 6/85  soft-tissue]
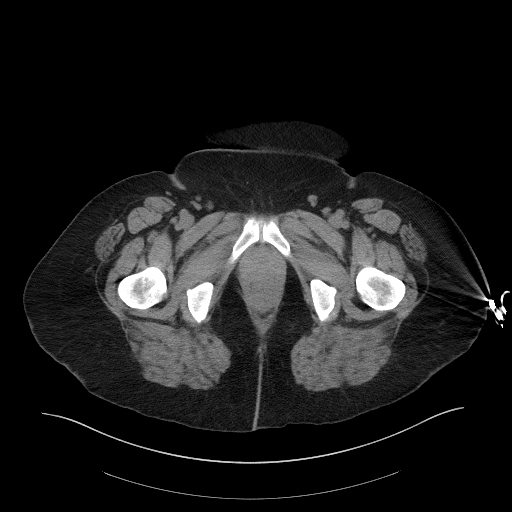
[im 6/85  bone]
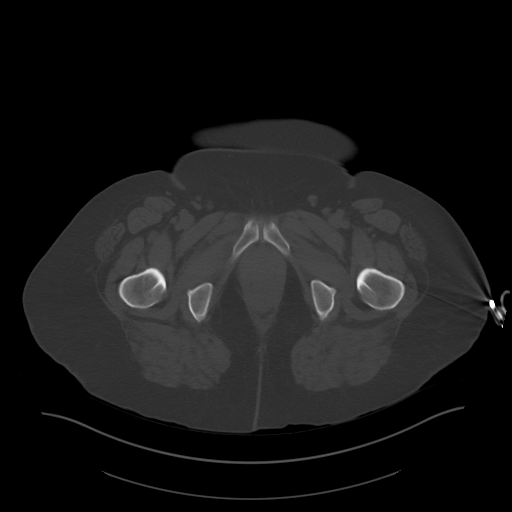
[im 12/85  soft-tissue]
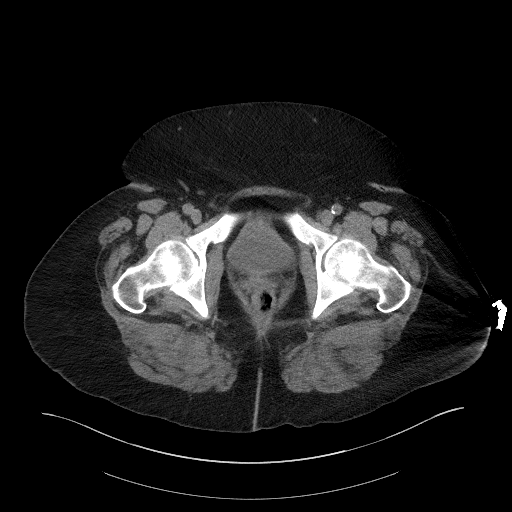
[im 17/85  soft-tissue]
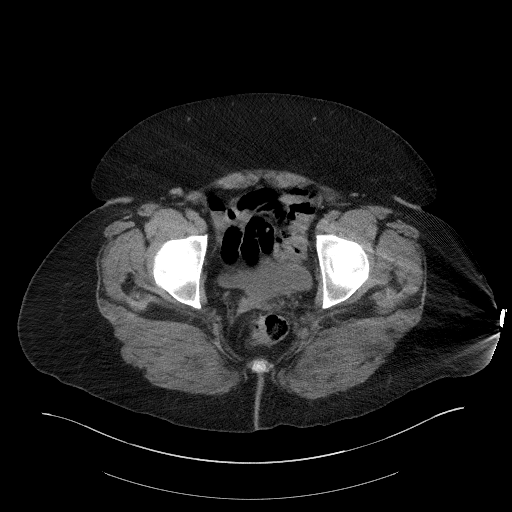
[im 23/85  soft-tissue]
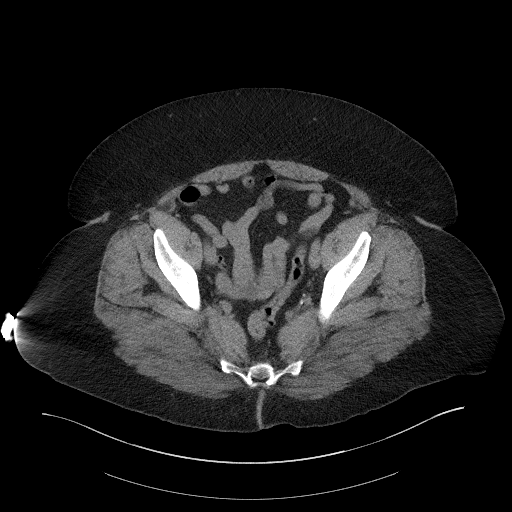
[im 29/85  soft-tissue]
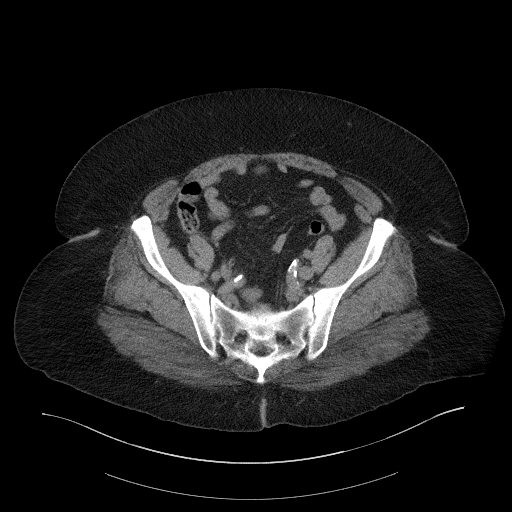
[im 34/85  soft-tissue]
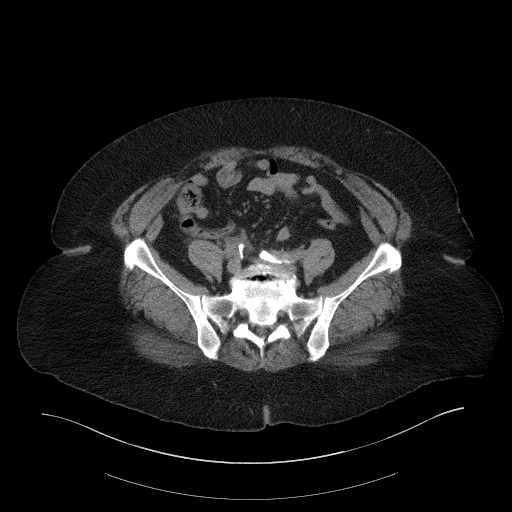
[im 40/85  soft-tissue]
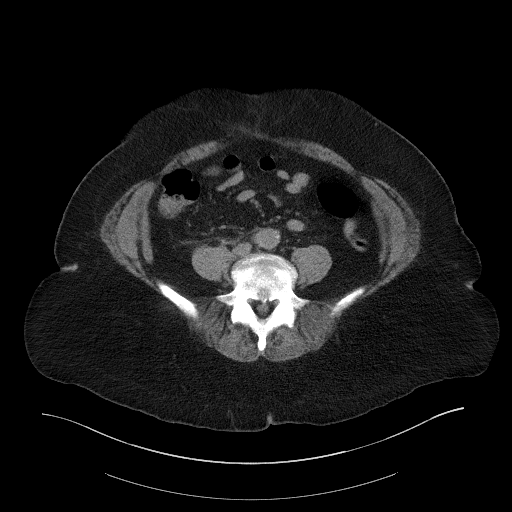
[im 45/85  soft-tissue]
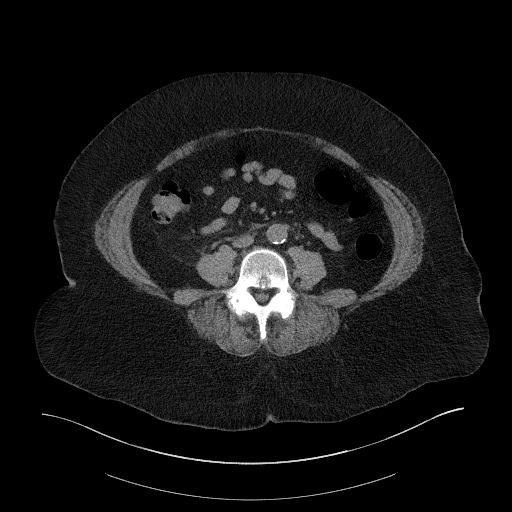
[im 51/85  soft-tissue]
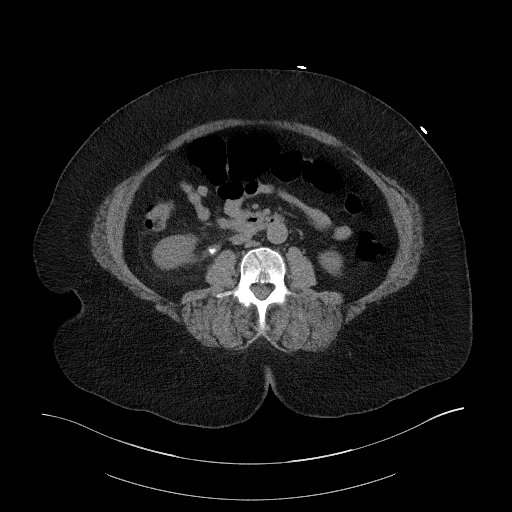
[im 51/85  bone]
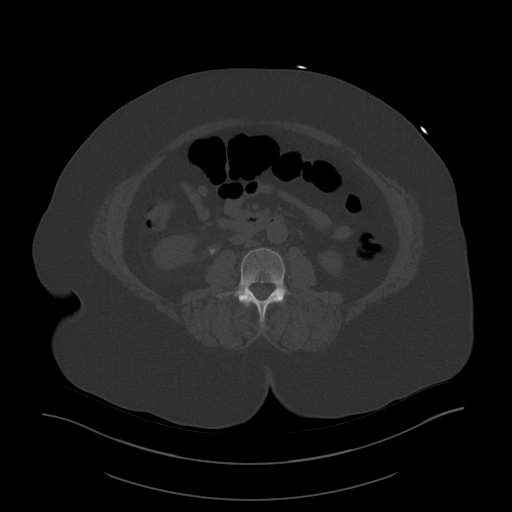
[im 57/85  soft-tissue]
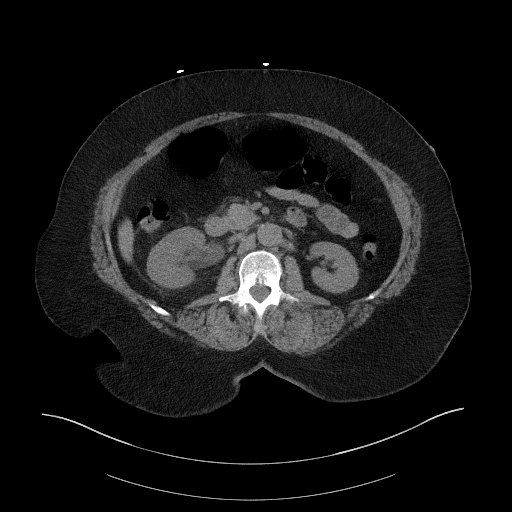
[im 62/85  soft-tissue]
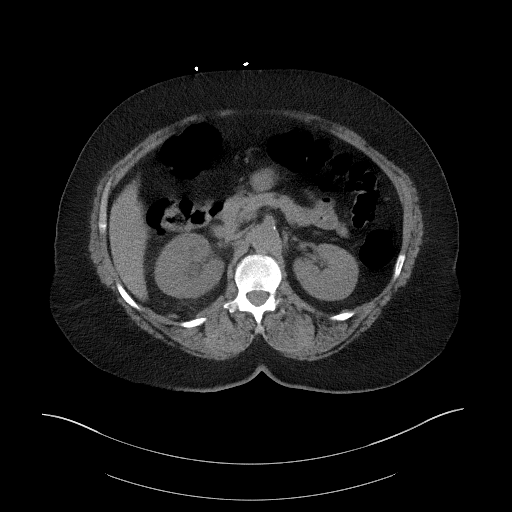
[im 68/85  soft-tissue]
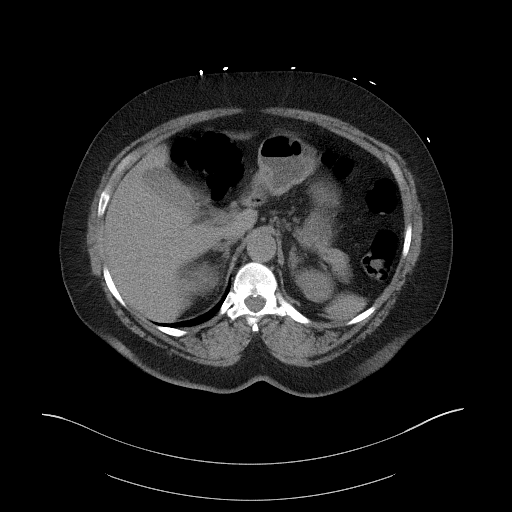
[im 73/85  soft-tissue]
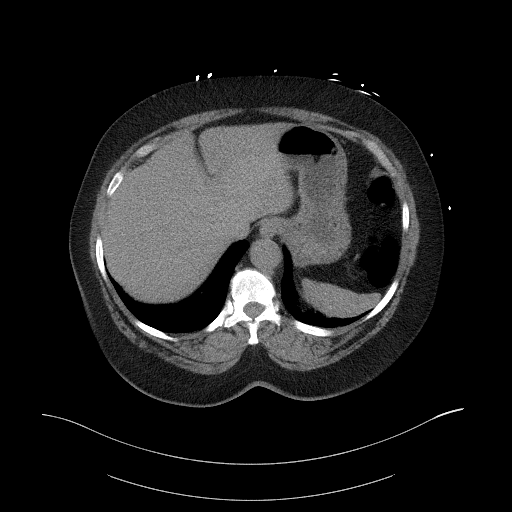
[im 79/85  soft-tissue]
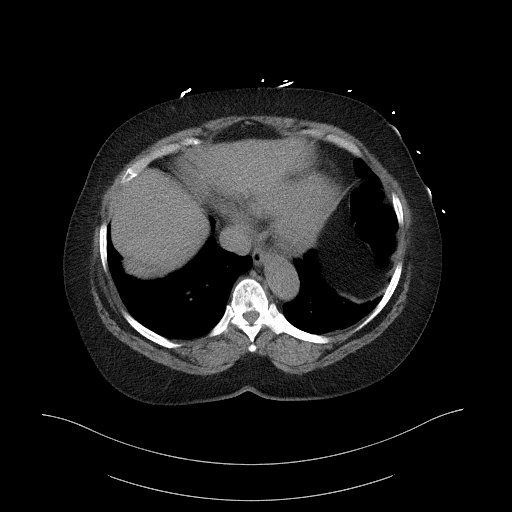

[17 of 46 positions shown; findings below may reference images not displayed]

FINDINGS: Lower chest: No acute abnormality.

Hepatobiliary: No focal hepatic abnormality. Gallbladder
unremarkable.

Pancreas: No focal abnormality or ductal dilatation.

Spleen: No focal abnormality.  Normal size.

Adrenals/Urinary Tract: Moderate right hydronephrosis and
perinephric stranding due to 5 mm proximal right ureteral stone.
Small nonobstructing stone in the lower pole of the right kidney. No
stones or hydronephrosis on the left. Urinary bladder and adrenal
glands unremarkable.

Stomach/Bowel: Stomach, large and small bowel grossly unremarkable.
Normal appendix.

Vascular/Lymphatic: Calcified aorta and iliac vessels. No evidence
of aneurysm or adenopathy.

Reproductive: Prior hysterectomy.  No adnexal masses.

Other: No free fluid or free air.

Musculoskeletal: No acute bony abnormality.
IMPRESSION: 5 mm proximal right ureteral stone with right hydronephrosis and
perinephric stranding.

Right lower pole nephrolithiasis.

Aortoiliac atherosclerosis.

## 2024-01-28 ENCOUNTER — Other Ambulatory Visit: Payer: Self-pay | Admitting: Student

## 2024-01-28 DIAGNOSIS — F17210 Nicotine dependence, cigarettes, uncomplicated: Secondary | ICD-10-CM

## 2024-02-25 ENCOUNTER — Inpatient Hospital Stay: Admission: RE | Admit: 2024-02-25 | Payer: Medicare Other | Source: Ambulatory Visit
